# Patient Record
Sex: Female | Born: 1975
Health system: Southern US, Community
[De-identification: ages and names within clinical notes are randomized; demographics above are authoritative.]

## PROBLEM LIST (undated history)

## (undated) DIAGNOSIS — F32A Depression, unspecified: Secondary | ICD-10-CM

## (undated) DIAGNOSIS — Z800 Family history of malignant neoplasm of digestive organs: Secondary | ICD-10-CM

## (undated) DIAGNOSIS — Z15068 Genetic susceptibility to other malignant neoplasm of digestive system: Secondary | ICD-10-CM

## (undated) DIAGNOSIS — Z1509 Genetic susceptibility to other malignant neoplasm: Secondary | ICD-10-CM

## (undated) DIAGNOSIS — Z803 Family history of malignant neoplasm of breast: Secondary | ICD-10-CM

## (undated) DIAGNOSIS — K5900 Constipation, unspecified: Secondary | ICD-10-CM

## (undated) DIAGNOSIS — Z8049 Family history of malignant neoplasm of other genital organs: Secondary | ICD-10-CM

## (undated) DIAGNOSIS — E559 Vitamin D deficiency, unspecified: Secondary | ICD-10-CM

## (undated) DIAGNOSIS — R569 Unspecified convulsions: Secondary | ICD-10-CM

## (undated) DIAGNOSIS — Z15060 Genetic susceptibility to colorectal cancer: Secondary | ICD-10-CM

## (undated) DIAGNOSIS — E785 Hyperlipidemia, unspecified: Secondary | ICD-10-CM

## (undated) HISTORY — DX: Constipation, unspecified: K59.00

## (undated) HISTORY — DX: Vitamin D deficiency, unspecified: E55.9

## (undated) HISTORY — PX: WISDOM TOOTH EXTRACTION: SHX21

## (undated) HISTORY — DX: Family history of malignant neoplasm of other genital organs: Z80.49

## (undated) HISTORY — DX: Hyperlipidemia, unspecified: E78.5

## (undated) HISTORY — PX: TUBAL LIGATION: SHX77

## (undated) HISTORY — DX: Genetic susceptibility to colorectal cancer: Z15.060

## (undated) HISTORY — DX: Genetic susceptibility to other malignant neoplasm of digestive system: Z15.068

## (undated) HISTORY — DX: Family history of malignant neoplasm of digestive organs: Z80.0

## (undated) HISTORY — DX: Depression, unspecified: F32.A

## (undated) HISTORY — DX: Family history of malignant neoplasm of breast: Z80.3

## (undated) HISTORY — DX: Genetic susceptibility to other malignant neoplasm: Z15.09

## (undated) HISTORY — PX: COLONOSCOPY: SHX174

## (undated) HISTORY — PX: ABDOMINAL HYSTERECTOMY: SHX81

## (undated) HISTORY — DX: Unspecified convulsions: R56.9

---

## 2013-12-22 ENCOUNTER — Encounter: Payer: Self-pay | Admitting: Neurology

## 2013-12-22 ENCOUNTER — Other Ambulatory Visit (INDEPENDENT_AMBULATORY_CARE_PROVIDER_SITE_OTHER): Payer: 59

## 2013-12-22 ENCOUNTER — Ambulatory Visit (INDEPENDENT_AMBULATORY_CARE_PROVIDER_SITE_OTHER): Payer: 59 | Admitting: Neurology

## 2013-12-22 VITALS — BP 113/67 | HR 72 | Ht 64.0 in | Wt 174.3 lb

## 2013-12-22 DIAGNOSIS — G40109 Localization-related (focal) (partial) symptomatic epilepsy and epileptic syndromes with simple partial seizures, not intractable, without status epilepticus: Secondary | ICD-10-CM

## 2013-12-22 DIAGNOSIS — R471 Dysarthria and anarthria: Secondary | ICD-10-CM

## 2013-12-22 DIAGNOSIS — R4701 Aphasia: Secondary | ICD-10-CM

## 2013-12-22 NOTE — Patient Instructions (Signed)
I had a long discussion with the patient regarding her episodes of transient speech difficulty/expressive aphasia and explained the differential diagnosis, plan for evaluation, treatment and answered questions. I recommend we check MRI scan of the brain for structural lesions and EEG for seizures. Review prior neurological records from [facility] neurology. We will hold off on an empirical trial of anticonvulsants at the present time unless episodes occur much more frequently. I have advised her to keep a strict track of these episodes. Return for followup in 2 months or call earlier if necessary.

## 2013-12-22 NOTE — Progress Notes (Signed)
[facility] [address] [place]. [place] [address] [phone]       OFFICE CONSULT NOTE  [name] Date of Birth:  [birth year] Medical Record Number:  [id number]   Referring MD:  Horald Pollen  Reason for Referral:  Speech arrest  HPI: 84 year Caucasian lady whose had recurrent intermittent episodes of transient speech and language difficulties for the last 5 years. The spells have increased in frequency over the last 1 year. She is states that the episode begins with patient having a kind of  aura and she is usually able to tell that she'll have trouble speaking. She cannot speak and usually just bladder the stomach frustrating sounds the tongue feels thick and heavy. Occasionally she is able to speak a few words with difficulty. In fact in the last episode 2 weeks ago she was able to tell her daughter to record the episode. She shows me a video  of this episode on her cell phone in which she is clearly frustrated in trying to talk and can speak a few isolated words with great difficulty. She seems quite awake and aware of her surroundings without any drooling of saliva or focal jerking or twitching noted on her face. Episodes never last more than 2-3 minutes. They can occur several days in a row and then not occur for months. There is no obvious triggers which will bring it on. Following that showed she feels quite tired and exhausted for a few hours. She is unable to tell me if she loses touch of her surroundings and can follow other people directions during the episode. She has never had an occasion to try writing during the episodes. She has no prior history of migraine headaches, seizures, loss of consciousness, significant head injury. Family history of epilepsy. She has no other history to suggest strokes TIAs in the past. She has seen a neurologist in [facility] for these episodes but cannot tell me the names and I have no records to review today. She does  not recall having had an EEG her MRI done. She had some recent lab work done on 12/15/13  by her primary physician which I have reviewed which showed normal comprehensive metabolic panel labs, ESR and TSH total cholesterol was 213, triglycerides 196, HDL 41  ROS:   14 system review of systems is positive for speech difficulty, expressive difficulties, emotional lability, frustration, anxiety and all other systems negative PMH: No past medical history on file.  Social History:  History   Social History  . Marital Status: Single    Spouse Name: N/A    Number of Children: 3  . Years of Education: AS   Occupational History  . [employer]    Social History Main Topics  . Smoking status: Never Smoker   . Smokeless tobacco: Not on file  . Alcohol Use: No  . Drug Use: No  . Sexual Activity: Yes   Other Topics Concern  . Not on file   Social History Narrative   Patient lives at home with her children    Patient is right handed   Patient drinks coffee daily    Medications:   No current outpatient prescriptions on file prior to visit.   No current facility-administered medications on file prior to visit.    Allergies:  No Known Allergies  Physical Exam General: well developed, well nourished, seated, in no evident distress Head: head normocephalic and atraumatic. Orohparynx benign Neck: supple with no  carotid or supraclavicular bruits Cardiovascular: regular rate and rhythm, no murmurs Musculoskeletal: no deformity Skin:  no rash/petichiae Vascular:  Normal pulses all extremities Filed Vitals:   12/22/13 0833  BP: 113/67  Pulse: 72    Neurologic Exam Mental Status: Awake and fully alert. Oriented to place and time. Recent and remote memory intact. Attention span, concentration and fund of knowledge appropriate. Mood and affect appropriate.  Cranial Nerves: Fundoscopic exam reveals sharp disc margins. Pupils equal, briskly reactive to light. Extraocular movements  full without nystagmus. Visual fields full to confrontation. Hearing intact. Facial sensation intact. Face, tongue, palate moves normally and symmetrically.  Motor: Normal bulk and tone. Normal strength in all tested extremity muscles. Sensory.: intact to tough and pinprick and vibratory.  Coordination: Rapid alternating movements normal in all extremities. Finger-to-nose and heel-to-shin performed accurately bilaterally. Gait and Station: Arises from chair without difficulty. Stance is normal. Gait demonstrates normal stride length and balance . Able to heel, toe and tandem walk without difficulty.  Reflexes: 1+ and symmetric. Toes downgoing.   NIHSS  0 Modified Rankin 0   ASSESSMENT: 23 year Caucasian lady with a recurrent transient episodes of speech and expressive language difficulties of unclear etiology. 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 Migraine  Or TIAs would be less likely.    PLAN: I had a long discussion with the patient regarding her episodes of transient speech difficulty/expressive aphasia and explained the differential diagnosis, plan for evaluation, treatment and answered questions. I recommend we check MRI scan of the brain for structural lesions and EEG for seizures. Review prior neurological records from [facility] neurolog[facility]. We will hold off on an empirical trial of anticonvulsants at the present time unless episodes occur much more frequently. I have advised her to keep a strict track of these episodes. Return for followup in 2 months or call earlier if necessary.   Note: This document was prepared with digital dictation and possible smart phrase technology. Any transcriptional errors that result from this process are unintentional.

## 2013-12-28 ENCOUNTER — Other Ambulatory Visit: Payer: 59

## 2014-06-05 ENCOUNTER — Telehealth: Payer: Self-pay | Admitting: Neurology

## 2014-06-05 NOTE — Telephone Encounter (Signed)
Patient is calling because she has had 3 more seizures.  Please call and advise if she needs an appt before 3/18 as scheduled.

## 2014-06-05 NOTE — Telephone Encounter (Signed)
Spoke with patient and scheduled appointment with Dr [name] for 06/08/14 at 2 pm

## 2014-06-08 ENCOUNTER — Ambulatory Visit (INDEPENDENT_AMBULATORY_CARE_PROVIDER_SITE_OTHER): Payer: 59 | Admitting: Neurology

## 2014-06-08 ENCOUNTER — Encounter: Payer: Self-pay | Admitting: Neurology

## 2014-06-08 VITALS — BP 122/72 | HR 70 | Ht 64.0 in | Wt 179.0 lb

## 2014-06-08 DIAGNOSIS — G40109 Localization-related (focal) (partial) symptomatic epilepsy and epileptic syndromes with simple partial seizures, not intractable, without status epilepticus: Secondary | ICD-10-CM

## 2014-06-08 MED ORDER — LEVETIRACETAM ER 500 MG PO TB24: 500.0000 mg | ORAL_TABLET | Freq: Every day | ORAL | Status: DC

## 2014-06-08 NOTE — Patient Instructions (Signed)
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 Episodes occur at a variable frequency but I think it may be worthwhile giving her empirical trial of anticonvulsants. Recommend she take Keppra XR 500 mg daily for at least 6 months. Repeat EEG study and MRI scan of the brain with and without contrast. Return for follow-up in 2 months or call earlier if necessary

## 2014-06-09 NOTE — Progress Notes (Signed)
[facility] [address] [place]. [place] [address] [phone]       OFFICE CONSULT NOTE  [name] Date of Birth:  02-May-1976 Medical Record Number:  [id number]   Referring MD:  Horald Pollen  Reason for Referral:  Speech arrest  HPI: 77 year Caucasian lady whose had recurrent intermittent episodes of transient speech and language difficulties for the last 5 years. The spells have increased in frequency over the last 1 year. She is states that the episode begins with patient having a kind of  aura and she is usually able to tell that she'll have trouble speaking. She cannot speak and usually just bladder the stomach frustrating sounds the tongue feels thick and heavy. Occasionally she is able to speak a few words with difficulty. In fact in the last episode 2 weeks ago she was able to tell her daughter to record the episode. She shows me a video  of this episode on her cell phone in which she is clearly frustrated in trying to talk and can speak a few isolated words with great difficulty. She seems quite awake and aware of her surroundings without any drooling of saliva or focal jerking or twitching noted on her face. Episodes never last more than 2-3 minutes. They can occur several days in a row and then not occur for months. There is no obvious triggers which will bring it on. Following that showed she feels quite tired and exhausted for a few hours. She is unable to tell me if she loses touch of her surroundings and can follow other people directions during the episode. She has never had an occasion to try writing during the episodes. She has no prior history of migraine headaches, seizures, loss of consciousness, significant head injury. Family history of epilepsy. She has no other history to suggest strokes TIAs in the past. She has seen a neurologist in [facility] for these episodes but cannot tell me the names and I have no records to review today. She does  not recall having had an EEG her MRI done. She had some recent lab work done on 12/15/13  by her primary physician which I have reviewed which showed normal comprehensive metabolic panel labs, ESR and TSH total cholesterol was 213, triglycerides 196, HDL 41 Update 06/08/2014 : She returns for follow-up after last visit 5 months ago. She continues to have intermittent episodes of transient speech and expressive language difficulties. She is accompanied by her mother stating that she had a brief episode today while in the parking lot coming to my office visit. Patient states that she had 2 other episodes on January 8 as well as December 19. In one of these she had a feeling of her neck being pulled to the left as well as a weird feeling in her left ear followed. Later by trouble speaking and expressing herself in completing sentences. She also had a blank look on her face and was apparently staring into space. She denies loss of memory and is able to recall the whole episode. She denied any headache before during or after the episode. She had no extremity weakness numbness gait or balance problems. She had an EEG done on 12/22/2013   which I personally reviewed which was normal without definite epileptiform features. She has no known prior history of seizures. ROS:   14 system review of systems is positive for speech difficulty, expressive difficulties, and all other systems negative PMH: No past medical history on  file.  Social History:  History   Social History  . Marital Status: Single    Spouse Name: N/A    Number of Children: 3  . Years of Education: AS   Occupational History  . [employer]    Social History Main Topics  . Smoking status: Never Smoker   . Smokeless tobacco: Not on file  . Alcohol Use: No  . Drug Use: No  . Sexual Activity: Yes   Other Topics Concern  . Not on file   Social History Narrative   Patient lives at home with her children    Patient is right handed    Patient drinks coffee daily    Medications:   No current outpatient prescriptions on file prior to visit.   No current facility-administered medications on file prior to visit.    Allergies:  No Known Allergies  Physical Exam General: well developed, well nourished, seated, in no evident distress Head: head normocephalic and atraumatic. Orohparynx benign Neck: supple with no carotid or supraclavicular bruits Cardiovascular: regular rate and rhythm, no murmurs Musculoskeletal: no deformity Skin:  no rash/petichiae Vascular:  Normal pulses all extremities Filed Vitals:   06/08/14 1403  BP: 122/72  Pulse: 70    Neurologic Exam Mental Status: Awake and fully alert. Oriented to place and time. Recent and remote memory intact. Attention span, concentration and fund of knowledge appropriate. Mood and affect appropriate.  Cranial Nerves: Fundoscopic exam not done   Pupils equal, briskly reactive to light. Extraocular movements full without nystagmus. Visual fields full to confrontation. Hearing intact. Facial sensation intact. Face, tongue, palate moves normally and symmetrically.  Motor: Normal bulk and tone. Normal strength in all tested extremity muscles. Sensory.: intact to tough and pinprick and vibratory.  Coordination: Rapid alternating movements normal in all extremities. Finger-to-nose and heel-to-shin performed accurately bilaterally. Gait and Station: Arises from chair without difficulty. Stance is normal. Gait demonstrates normal stride length and balance . Able to heel, toe and tandem walk without difficulty.  Reflexes: 1+ and symmetric. Toes downgoing.     ASSESSMENT: 9 year Caucasian lady with a recurrent transient episodes of speech and expressive language difficulties of unclear etiology. 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 Migraine  Or TIAs would be less likely.    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 Episodes occur at a variable frequency but I think it may be worthwhile giving her empirical trial of anticonvulsants. Recommend she take Keppra XR 500 mg daily for at least 6 months. Repeat EEG study and MRI scan of the brain with and without contrast. Return for follow-up in 2 months or call earlier if necessary   Antony Contras, MD Note: This document was prepared with digital dictation and possible smart phrase technology. Any transcriptional errors that result from this process are unintentional.

## 2014-06-21 ENCOUNTER — Ambulatory Visit (INDEPENDENT_AMBULATORY_CARE_PROVIDER_SITE_OTHER): Payer: 59

## 2014-06-21 ENCOUNTER — Other Ambulatory Visit (INDEPENDENT_AMBULATORY_CARE_PROVIDER_SITE_OTHER): Payer: 59 | Admitting: Neurology

## 2014-06-21 DIAGNOSIS — G40109 Localization-related (focal) (partial) symptomatic epilepsy and epileptic syndromes with simple partial seizures, not intractable, without status epilepticus: Secondary | ICD-10-CM

## 2014-06-21 MED ORDER — GADOPENTETATE DIMEGLUMINE 469.01 MG/ML IV SOLN: 15.0000 mL | Freq: Once | INTRAVENOUS | Status: AC | PRN

## 2014-07-10 ENCOUNTER — Telehealth: Payer: Self-pay | Admitting: *Deleted

## 2014-07-10 NOTE — Telephone Encounter (Signed)
Patient had MRI on 06-22-14 and would like results. Please advise.

## 2014-07-10 NOTE — Telephone Encounter (Signed)
Spoke with patient and informed her of normal MRI and confirmed her appointment for 08/04/14 at 10:30 am with Dr [name]. Patient states that she will wait for appointment to go over MRI

## 2014-08-04 ENCOUNTER — Encounter: Payer: Self-pay | Admitting: Neurology

## 2014-08-04 ENCOUNTER — Ambulatory Visit (INDEPENDENT_AMBULATORY_CARE_PROVIDER_SITE_OTHER): Payer: 59 | Admitting: Neurology

## 2014-08-04 VITALS — BP 113/71 | HR 65 | Ht 64.0 in | Wt 181.2 lb

## 2014-08-04 DIAGNOSIS — R569 Unspecified convulsions: Secondary | ICD-10-CM | POA: Diagnosis not present

## 2014-08-04 DIAGNOSIS — IMO0001 Reserved for inherently not codable concepts without codable children: Secondary | ICD-10-CM

## 2014-08-04 DIAGNOSIS — R6889 Other general symptoms and signs: Principal | ICD-10-CM

## 2014-08-04 NOTE — Progress Notes (Signed)
[facility] [address] [place]. [place] [address] [phone]       OFFICE FOLLOW UP VISIT  NOTE  [name] Date of Birth:  1976/04/21 Medical Record Number:  [id number]   Referring MD:  Horald Pollen  Reason for Referral:  Speech arrest  HPI:  Initial Consult 06/09/2014 : 56 year Caucasian lady whose had recurrent intermittent episodes of transient speech and language difficulties for the last 5 years. The spells have increased in frequency over the last 1 year. She is states that the episode begins with patient having a kind of  aura and she is usually able to tell that she'll have trouble speaking. She cannot speak and usually just bladder the stomach frustrating sounds the tongue feels thick and heavy. Occasionally she is able to speak a few words with difficulty. In fact in the last episode 2 weeks ago she was able to tell her daughter to record the episode. She shows me a video  of this episode on her cell phone in which she is clearly frustrated in trying to talk and can speak a few isolated words with great difficulty. She seems quite awake and aware of her surroundings without any drooling of saliva or focal jerking or twitching noted on her face. Episodes never last more than 2-3 minutes. They can occur several days in a row and then not occur for months. There is no obvious triggers which will bring it on. Following that showed she feels quite tired and exhausted for a few hours. She is unable to tell me if she loses touch of her surroundings and can follow other people directions during the episode. She has never had an occasion to try writing during the episodes. She has no prior history of migraine headaches, seizures, loss of consciousness, significant head injury. Family history of epilepsy. She has no other history to suggest strokes TIAs in the past. She has seen a neurologist in [facility] for these episodes but cannot tell me the names and I  have no records to review today. She does not recall having had an EEG her MRI done. She had some recent lab work done on 12/15/13  by her primary physician which I have reviewed which showed normal comprehensive metabolic panel labs, ESR and TSH total cholesterol was 213, triglycerides 196, HDL 41 Update 06/08/2014 : She returns for follow-up after last visit 5 months ago. She continues to have intermittent episodes of transient speech and expressive language difficulties. She is accompanied by her mother stating that she had a brief episode today while in the parking lot coming to my office visit. Patient states that she had 2 other episodes on January 8 as well as December 19. In one of these she had a feeling of her neck being pulled to the left as well as a weird feeling in her left ear followed. Later by trouble speaking and expressing herself in completing sentences. She also had a blank look on her face and was apparently staring into space. She denies loss of memory and is able to recall the whole episode. She denied any headache before during or after the episode. She had no extremity weakness numbness gait or balance problems. She had an EEG done on 12/22/2013   which I personally reviewed which was normal without definite epileptiform features. She has no known prior history of seizures. Update 08/04/2014 ; she returns for follow-up after last visit 2 months ago. She has had no further  episodes of speech or language difficulties. She did not try the Ver[place] which I prescribed at last visit as she wants to wait till she has another episode. She had a repeat MRI scan of the brain done on 06/15/14 which are personally reviewed and appears normal. She had EEG also done on 06/21/14 which was normal without any epileptiform activities. She plans to exercise regularly and lose some weight. I also encouraged her to increase perspiration and stress relaxation activities. She has no other complaints. ROS:   14 system  review of systems is positive for speech difficulty, expressive difficulties, and all other systems negative PMH:  Past Medical History  Diagnosis Date  . Seizures     Social History:  History   Social History  . Marital Status: Single    Spouse Name: N/A  . Number of Children: 3  . Years of Education: AS   Occupational History  . [employer]    Social History Main Topics  . Smoking status: Never Smoker   . Smokeless tobacco: Never Used  . Alcohol Use: No  . Drug Use: No  . Sexual Activity: Yes   Other Topics Concern  . Not on file   Social History Narrative   Patient lives at home with her children    Patient is right handed   Patient drinks coffee daily    Medications:   Current Outpatient Prescriptions on File Prior to Visit  Medication Sig Dispense Refill  . levETIRAcetam (KEPPRA XR) 500 MG 24 hr tablet Take 1 tablet (500 mg total) by mouth daily. (Patient not taking: Reported on 08/04/2014) 30 tablet 3   No current facility-administered medications on file prior to visit.    Allergies:  No Known Allergies  Physical Exam General: well developed, well nourished young Caucasian lady, seated, in no evident distress Head: head normocephalic and atraumatic.    Neck: supple with no carotid or supraclavicular bruits Cardiovascular: regular rate and rhythm, no murmurs Musculoskeletal: no deformity Skin:  no rash/petichiae Vascular:  Normal pulses all extremities Filed Vitals:   08/04/14 1056  BP: 113/71  Pulse: 65    Neurologic Exam Mental Status: Awake and fully alert. Oriented to place and time. Recent and remote memory intact. Attention span, concentration and fund of knowledge appropriate. Mood and affect appropriate.  Cranial Nerves: Fundoscopic exam not done   Pupils equal, briskly reactive to light. Extraocular movements full without nystagmus. Visual fields full to confrontation. Hearing intact. Facial sensation intact. Face, tongue, palate moves  normally and symmetrically.  Motor: Normal bulk and tone. Normal strength in all tested extremity muscles. Sensory.: intact to tough and pinprick and vibratory.  Coordination: Rapid alternating movements normal in all extremities. Finger-to-nose and heel-to-shin performed accurately bilaterally. Gait and Station: Arises from chair without difficulty. Stance is normal. Gait demonstrates normal stride length and balance . Able to heel, toe and tandem walk without difficulty.  Reflexes: 1+ and symmetric. Toes downgoing.     ASSESSMENT: 41 year Caucasian lady with a recurrent transient episodes of speech and expressive language difficulties of unclear etiology. 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 Migraine  Or TIAs would be less likely.    PLAN:  I had a long discussion with the patient with regards to her episodes and personally reviewed and discuss MRI films and EEG results and answered questions. She appears reluctant to go on empirical trial of Keppra at the present time as she has been episode free for 2 months. She  however will consider trying it when she has future episodes. Return for follow-up in 3 months or call earlier if necessary. Antony Contras, MD Note: This document was prepared with digital dictation and possible smart phrase technology. Any transcriptional errors that result from this process are unintentional.

## 2014-08-04 NOTE — Patient Instructions (Signed)
  I had a long discussion with the patient with regards to her episodes and personally reviewed and discuss MRI films and EEG results and answered questions. She appears reluctant to go on empirical trial of Keppra at the present time as she has been episode free for 2 months. She however will consider trying it when she has future episodes. Return for follow-up in 3 months or call earlier if necessary.

## 2014-08-07 ENCOUNTER — Ambulatory Visit (HOSPITAL_BASED_OUTPATIENT_CLINIC_OR_DEPARTMENT_OTHER): Payer: 59 | Admitting: Genetic Counselor

## 2014-08-07 ENCOUNTER — Other Ambulatory Visit: Payer: 59

## 2014-08-07 ENCOUNTER — Encounter: Payer: Self-pay | Admitting: Genetic Counselor

## 2014-08-07 DIAGNOSIS — Z8049 Family history of malignant neoplasm of other genital organs: Secondary | ICD-10-CM

## 2014-08-07 DIAGNOSIS — Z803 Family history of malignant neoplasm of breast: Secondary | ICD-10-CM

## 2014-08-07 DIAGNOSIS — Z808 Family history of malignant neoplasm of other organs or systems: Secondary | ICD-10-CM

## 2014-08-07 DIAGNOSIS — Z800 Family history of malignant neoplasm of digestive organs: Secondary | ICD-10-CM | POA: Insufficient documentation

## 2014-08-07 DIAGNOSIS — Z315 Encounter for genetic counseling: Secondary | ICD-10-CM

## 2014-08-07 NOTE — Progress Notes (Signed)
REFERRING PROVIDER: Hayden Rasmussen, MD [place], [place] [address]  PRIMARY PROVIDER:  Hayden Rasmussen., MD  PRIMARY REASON FOR VISIT:  1. Family history of uterine cancer   2. Family history of breast cancer   3. Family history of Lynch syndrome      HISTORY OF PRESENT ILLNESS:   Ms. Gu, a 39 y.o. female, was seen for a [place] cancer genetics consultation at the request of Dr. Darron Doom due to a family history of cancer.  Ms. Batley presents to clinic today to discuss the possibility of a hereditary predisposition to cancer, genetic testing, and to further clarify her future cancer risks, as well as potential cancer risks for family members. Ms. Northrop is a 39 y.o. female with no personal history of cancer.  Her mother was diagnosed with uterine cancer and breast cancer, and had genetic testing which found a PMS2 mutation, causitive of Lynch syndrome.  Reportedly her sister also was found to be positive for PMS2 and had a hysterectomy this past year.   CANCER HISTORY:   No history exists.    RISK FACTORS Ovaries intact: yes.  Hysterectomy: no.  Menopausal status: premenopausal.  HRT use: 0 years. Colonoscopy: no; not examined. Mammogram within the last year: yes. Number of breast biopsies: 0. Up to date with pelvic exams:  yes. Any excessive radiation exposure in the past:  no  Past Medical History  Diagnosis Date  . Seizures   . Family history of uterine cancer   . Family history of breast cancer   . Family history of Lynch syndrome     Past Surgical History  Procedure Laterality Date  . Cesarean section      History   Social History  . Marital Status: Single    Spouse Name: N/A  . Number of Children: 3  . Years of Education: AS   Occupational History  . [employer]    Social History Main Topics  . Smoking status: Never Smoker   . Smokeless tobacco: Never Used  . Alcohol Use: No  . Drug Use: No  . Sexual Activity: Yes   Other  Topics Concern  . None   Social History Narrative   Patient lives at home with her children    Patient is right handed   Patient drinks coffee daily     FAMILY HISTORY:  We obtained a detailed, 4-generation family history.  Significant diagnoses are listed below: Family History  Problem Relation Age of Onset  . Breast cancer Mother 20  . Uterine cancer Mother 80  . COPD Maternal Aunt   . Prostate cancer Maternal Grandfather     dx in his 49s  . Cancer Cousin 6    wilms tumor  . Colon cancer Cousin     mother's maternal cousin  . Colon cancer Other     mother's maternal great aunt  . Pancreatic cancer Cousin     mother's paternal first cousin   Ms. Pinales's mother was diagnosed with breast and uterine cancer, and was ultimately found to have Lynch syndrome.  Reportedly her sister has as well.  Ms. Kaner mother had two sisters and two brothers, none of whom had cancer.  One sister had a daughter diagnosed at age 53 with wilms tumor.  Ms. Badilla mother had a maternal cousin and maternal great aunt with colon cancer and a paternal cousin with pancreatic cancer.   Patient's maternal ancestors are of Caucasian descent, and paternal ancestors are  of Caucasian descent. There is no reported Ashkenazi Jewish ancestry. There is no known consanguinity.  GENETIC COUNSELING ASSESSMENT: Teola Felipe is a 39 y.o. female with a family history of Lynch syndrome and a predisposition to cancer. We, therefore, discussed and recommended the following at today's visit.   DISCUSSION: We reviewed the characteristics, features and inheritance patterns of Lynch syndromes. Based on her mother's genetic test result, Ms. Pointer is at 50% risk for inheriting the PMS2 mutation found in both her mother and sister.  We discussed the increased risk for colon, uterine and ovarian cancer with PMS2 mutations, but that other cancers typcially seen in Lynch syndrome are much less frequent and less concerning with PMS2  mutations.  We discussed how her medical management would change should she test positive and that we would recommend genetic testing for her children.  We also discussed informing her maternal aunts and uncles of this result.  Wilms tumor can sometimes be seen in Lynch syndrome, therefore we would want to test Ms. Mastel's maternal aunt to confirm whether she too has a PMS2 mutation that could have contributed to the wilms tumor and therefore put her children at risk for cancer, as well as herself.  We also discussed genetic testing, including the appropriate family members to test, the process of testing, insurance coverage and turn-around-time for results. We discussed the implications of a negative, positive and/or variant of uncertain significant result. We recommended Ms. Pottinger pursue genetic testing for the targeted PMS2 gene mutation.  The mutation found in Ms. Seelinger's family is PMS2 c.137G>T.Marland Kitchen   PLAN: After considering the risks, benefits, and limitations, Ms. Dow  provided informed consent to pursue genetic testing and the blood sample was sent to [facility] for analysis of the PMS2 gene test. Results should be available within approximately 3-4 weeks' time, at which point they will be disclosed by telephone to Ms. Bovee, as will any additional recommendations warranted by these results. Ms. Amara will receive a summary of her genetic counseling visit and a copy of her results once available. This information will also be available in Epic. We encouraged Ms. Grisanti to remain in contact with cancer genetics annually so that we can continuously update the family history and inform her of any changes in cancer genetics and testing that may be of benefit for her family. Ms. Laster questions were answered to her satisfaction today. Our contact information was provided should additional questions or concerns arise.  Lastly, we encouraged Ms. Cuthrell to remain in contact with cancer genetics annually so that  we can continuously update the family history and inform her of any changes in cancer genetics and testing that may be of benefit for this family.   Ms.  Karel questions were answered to her satisfaction today. Our contact information was provided should additional questions or concerns arise. Thank you for the referral and allowing [place] to share in the care of your patient.   Darryel Diodato P. Florene Glen, [place], [facility] Certified Genetic Counselor [email or web address] phone: [phone]  The patient was seen for a total of 45 minutes in face-to-face genetic counseling.  This patient was discussed with Drs. Magrinat, Lindi Adie and/or Burr Medico who agrees with the above.    _______________________________________________________________________ For Office Staff:  Number of people involved in session: 2 Was an Intern/ student involved with case: yes

## 2014-08-15 ENCOUNTER — Telehealth: Payer: Self-pay | Admitting: Genetic Counselor

## 2014-08-15 ENCOUNTER — Encounter: Payer: Self-pay | Admitting: Genetic Counselor

## 2014-08-15 DIAGNOSIS — Z1379 Encounter for other screening for genetic and chromosomal anomalies: Secondary | ICD-10-CM | POA: Insufficient documentation

## 2014-08-15 DIAGNOSIS — Z1509 Genetic susceptibility to other malignant neoplasm: Secondary | ICD-10-CM | POA: Insufficient documentation

## 2014-08-15 NOTE — Telephone Encounter (Signed)
Alva Garnet, genetic counseling intern, revealed the PMS2 mutation found on genetic testing.  Patient is scheduled to return for genetic counseling on Wednesday March 30 at 3 PM

## 2014-08-16 ENCOUNTER — Ambulatory Visit (HOSPITAL_BASED_OUTPATIENT_CLINIC_OR_DEPARTMENT_OTHER): Payer: 59 | Admitting: Genetic Counselor

## 2014-08-16 ENCOUNTER — Encounter: Payer: Self-pay | Admitting: Genetic Counselor

## 2014-08-16 DIAGNOSIS — Z1379 Encounter for other screening for genetic and chromosomal anomalies: Secondary | ICD-10-CM

## 2014-08-16 DIAGNOSIS — Z315 Encounter for genetic counseling: Secondary | ICD-10-CM | POA: Diagnosis not present

## 2014-08-16 DIAGNOSIS — Z1509 Genetic susceptibility to other malignant neoplasm: Secondary | ICD-10-CM

## 2014-08-16 DIAGNOSIS — Z803 Family history of malignant neoplasm of breast: Secondary | ICD-10-CM | POA: Diagnosis not present

## 2014-08-16 DIAGNOSIS — Z8049 Family history of malignant neoplasm of other genital organs: Secondary | ICD-10-CM | POA: Diagnosis not present

## 2014-08-16 NOTE — Progress Notes (Signed)
REFERRING PROVIDER: Hayden Rasmussen, MD [place], [place] [address]   Truitt Merle, MD  PRIMARY PROVIDER:  Hayden Mason., MD  PRIMARY REASON FOR VISIT:  1. Family history of uterine cancer   2. Family history of breast cancer   3. Lynch syndrome   4. Genetic testing   5. PMS2-related Lynch syndrome (HNPCC4)      HISTORY OF PRESENT ILLNESS:   [name], a 39 y.o. female, was seen to discuss her genetic test results that revealed she has the familial PMS2 mutation which has diagnosed [name] with Lynch syndrome.  [name] presents to clinic today to discuss Lynch syndrome and her hereditary predisposition to cancer, and to further clarify her future cancer risks, as well as potential cancer risks for family members. [name] has no personal history of cancer.  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  CANCER HISTORY:   No history exists.    Past Medical History  Diagnosis Date  . Seizures   . Family history of uterine cancer   . Family history of breast cancer   . Family history of Lynch syndrome   . Lynch syndrome   . PMS2-related Lynch syndrome [facility])     Past Surgical History  Procedure Laterality Date  . Cesarean section      History   Social History  . Marital Status: Single    Spouse Name: N/A  . Number of Children: 3  . Years of Education: AS   Occupational History  . [employer]    Social History Main Topics  . Smoking status: Never Smoker   . Smokeless tobacco: Never Used  . Alcohol Use: No  . Drug Use: No  . Sexual Activity: Yes   Other Topics Concern  . None   Social History Narrative   Patient lives at home with her children    Patient is right handed   Patient drinks coffee daily     FAMILY HISTORY:  We obtained a detailed, 4-generation family history.  Significant diagnoses are listed below: Family History  Problem Relation Age of Onset  . Breast cancer Mother 47  . Uterine cancer Mother 58  . COPD  Maternal Aunt   . Prostate cancer Maternal Grandfather     dx in his 50s  . Cancer Cousin 6    wilms tumor  . Colon cancer Cousin     mother's maternal cousin  . Colon cancer Other     mother's maternal great aunt  . Pancreatic cancer Cousin     mother's paternal first cousin    GENETIC COUNSELING ASSESSMENT: Leah Mason is a 39 y.o. female with a PMS2 mutation which is suggestive of a Lynch syndrome and predisposition to cancer. We, therefore, discussed and recommended the following at today's visit.   DISCUSSION: We reviewed the characteristics, features and inheritance patterns of Lynch syndromes. We discussed that a PMS2 mutation increases the lifetime risk of colon cancer to about 20% and the lifetime risk for uterine cancer to about 15%. Therefore, according to NCCN guidelines, a colonoscopy should be performed every 1-2 years starting at 20-25 years. Additionally screening for endometrial and ovarian cancer is not sensitive. Therefore a prophylactic TAH-BSO is recommended once families are completed. The risks for all other extracolonic cancers is low. Due to limited data, there are no other recommended screening options for these cancers.   [name] has not had a colonoscopy yet, and is dreading it.  She has had a colonoscopy in the past for a different reason, and "it was awful and I swore I would never do it again".  We discussed the process of colonoscopy and have referred her to the [facility] group for a consultation.  Additionally, she will go back to Leah Mason to discuss TAH-BSO.  Lastly, we will refer [name] to Leah Mason in the [facility] to ensure that she is fully plugged in for her screening.  PLAN: Based on [name]'s family history, we recommended her maternal aunts and uncles and cousins have genetic counseling and testing. [name] will let us know if we can be of any assistance in coordinating genetic counseling and/or testing for this  family member. [name] was given information on how to set up appointments with [facility] and I will have Leah Mason call her to set up an appointment with Leah Mason.  [name] will call Dr. Darron Doom to set up an appointment.  Lastly, we encouraged [name] to remain in contact with cancer genetics annually so that we can continuously update the family history and inform her of any changes in cancer genetics and testing that may be of benefit for this family.   Ms.  Leah Mason questions were answered to her satisfaction today. Our contact information was provided should additional questions or concerns arise. Thank you for the referral and allowing [place] to share in the care of your patient.   Leah Mason P. Florene Glen, [place], [facility] Certified Genetic Counselor [email or web address] phone: [phone]  The patient was seen for a total of 30 minutes in face-to-face genetic counseling.  This patient was discussed with Drs. Magrinat, Lindi Adie and/or Burr Mason who agrees with the above.    _______________________________________________________________________ For Office Staff:  Number of people involved in session: 1 Was an Intern/ student involved with case: yes

## 2014-08-17 ENCOUNTER — Telehealth: Payer: Self-pay | Admitting: *Deleted

## 2014-08-17 NOTE — Telephone Encounter (Signed)
Received call from Maylon CosKaren Powell to schedule pt w/ Dr. Mosetta PuttFeng for High Risk.  Called pt and mail box has not been set up yet.  I will call again.

## 2014-08-18 ENCOUNTER — Telehealth: Payer: Self-pay | Admitting: *Deleted

## 2014-08-18 NOTE — Telephone Encounter (Signed)
Called pt and confirmed 09/05/14 high risk appt w/ pt.  Called Maylon CosKaren Powell to make her aware.  [facility]

## 2014-09-04 ENCOUNTER — Telehealth: Payer: Self-pay | Admitting: *Deleted

## 2014-09-04 NOTE — Telephone Encounter (Signed)
Was unable to leave a message for the pt, called mother and got her on the phone. Asked her to tell her daughter to call us back asap so we can get her follow up appt rescheduled. When the pt calls back, please reschedule her

## 2014-09-05 ENCOUNTER — Ambulatory Visit (HOSPITAL_BASED_OUTPATIENT_CLINIC_OR_DEPARTMENT_OTHER): Payer: 59 | Admitting: Hematology

## 2014-09-05 ENCOUNTER — Ambulatory Visit: Payer: 59

## 2014-09-05 ENCOUNTER — Encounter: Payer: Self-pay | Admitting: Hematology

## 2014-09-05 VITALS — BP 116/60 | HR 66 | Temp 98.4°F | Resp 20 | Ht 64.0 in | Wt 184.2 lb

## 2014-09-05 DIAGNOSIS — Z803 Family history of malignant neoplasm of breast: Secondary | ICD-10-CM | POA: Diagnosis not present

## 2014-09-05 DIAGNOSIS — Z1509 Genetic susceptibility to other malignant neoplasm: Secondary | ICD-10-CM

## 2014-09-05 DIAGNOSIS — Z808 Family history of malignant neoplasm of other organs or systems: Secondary | ICD-10-CM | POA: Diagnosis not present

## 2014-09-05 NOTE — Progress Notes (Signed)
[facility]  Telephone:([phone] Fax:([phone]) [place] Note   Patient Care Team: Hayden Rasmussen, MD as PCP - General (Family Medicine) 09/05/2014  CHIEF COMPLAINTS/PURPOSE OF CONSULTATION:  PMS2-related Lynch syndrome (HNPCC4)   HISTORY OF PRESENTING ILLNESS:  Leah Mason 39 y.o. female is here because of family history of brain syndrome andrecently discovered PMS2 gene mutation.  Her mother had breast cancer at age of 26, and uterine cancer at age of 47. She survived from both cancer, and underwent genetic counseling and testing. She was to found to have PMS2 gene mutation. So patient and her sister underwent genetic mutation and both were found to have the same gene mutation. Post-patient and her sister had a history of malignancy.  She is pretty healthy, no other medical issues. She feels well overall, denies any symptoms. She does have mild constipation, and occasionally notice blood in her stool, average once a month. She has some GI bleeding when she was in 40s, and underwent colonoscopy. The procedure was actually aborted because of the pain and intolerance. She has not had any colonoscopy since then. She did have screening mammogram several years ago after her mom was diagnosed with breast cancer, which was negative.   MEDICAL HISTORY:  Past Medical History  Diagnosis Date  . Seizures   . Family history of uterine cancer   . Family history of breast cancer   . Family history of Lynch syndrome   . Lynch syndrome   . PMS2-related Lynch syndrome (HNPCC4)    GYN HISTORY  Menarchal: 11 LMP: 08/24/2014 Contraceptive: no HRT: no G3P3: 12, 11 and 8    SURGICAL HISTORY: Past Surgical History  Procedure Laterality Date  . Cesarean section      SOCIAL HISTORY: History   Social History  . Marital Status: Single    Spouse Name: N/A  . Number of Children: 3  . Years of Education: AS   Occupational History  . [employer]    Social History Main Topics  . Smoking status: Never Smoker   . Smokeless tobacco: Never Used  . Alcohol Use: No  . Drug Use: No  . 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 Breast cancer Mother 61  . Uterine cancer Mother 67  . COPD Maternal Aunt   . Prostate cancer Maternal Grandfather     dx in his 63s  . Cancer Cousin 6    wilms tumor  . Colon cancer Cousin     mother's maternal cousin  . Colon cancer Other     mother's maternal great aunt  . Pancreatic cancer Cousin     mother's paternal first cousin    ALLERGIES:  has No Known Allergies.  MEDICATIONS:  None   REVIEW OF SYSTEMS:   Constitutional: Denies fevers, chills or abnormal night sweats Eyes: Denies blurriness of vision, double vision or watery eyes Ears, nose, mouth, throat, and face: Denies mucositis or sore throat Respiratory: Denies cough, dyspnea or wheezes Cardiovascular: Denies palpitation, chest discomfort or lower extremity swelling Gastrointestinal:  Denies nausea, heartburn or change in bowel habits Skin: Denies abnormal skin rashes Lymphatics: Denies new lymphadenopathy or easy bruising Neurological:Denies numbness, tingling or new  weaknesses Behavioral/Psych: Mood is stable, no new changes  All other systems were reviewed with the patient and are negative.  PHYSICAL EXAMINATION: ECOG PERFORMANCE STATUS: 0 - Asymptomatic  Filed Vitals:   09/05/14 1403  BP: 116/60  Pulse: 66  Temp: 98.4 F (36.9 C)  Resp: 20   Filed Weights   09/05/14 1403  Weight: 184 lb 3.2 oz (83.553 kg)    GENERAL:alert, no distress and comfortable SKIN: skin color, texture, turgor are normal, no rashes or significant lesions EYES: normal, conjunctiva are pink and non-injected, sclera  clear OROPHARYNX:no exudate, no erythema and lips, buccal mucosa, and tongue normal  NECK: supple, thyroid normal size, non-tender, without nodularity LYMPH:  no palpable lymphadenopathy in the cervical, axillary or inguinal LUNGS: clear to auscultation and percussion with normal breathing effort HEART: regular rate & rhythm and no murmurs and no lower extremity edema ABDOMEN:abdomen soft, non-tender and normal bowel sounds Musculoskeletal:no cyanosis of digits and no clubbing  PSYCH: alert & oriented x 3 with fluent speech NEURO: no focal motor/sensory deficits  LABORATORY DATA:  I have reviewed the data as listed No results found for: WBC, HGB, HCT, MCV, PLT No results for input(s): NA, K, CL, CO2, GLUCOSE, BUN, CREATININE, CALCIUM, GFRNONAA, GFRAA, PROT, ALBUMIN, AST, ALT, ALKPHOS, BILITOT, BILIDIR, IBILI in the last 8760 hours.  [facility] GENETIC RESULT: PMS2 c137g>t (p ser461Ile), heterozygous  RADIOGRAPHIC STUDIES: I have personally reviewed the radiological images as listed and agreed with the findings in the report. No results found.  ASSESSMENT & PLAN:  39 year old Caucasian female, with family history of breast cancer and uterine cancer in her mother, and subsequently was found to be PMS2 mutation heterozygous.  1. 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 -we discussed that this is one of the most common genetic mutations which causes Lynch syndrome. -patients with germline PMS 2 mutations carriers 15-20% lifetime risk of colon cancer by age of 91,  With the media age of onset in 62s, and 15% lifetime risk of endometrial cancer by age of 62, with media age of onset 40 years old. -PMS2 mutation carrier also has slightly increased risk of extra chronic malignancy, such as stomach, ovary, hepatobiliary track, urinary tract, small bowel, brain, and the combined risk for these malignancies is 6% to age 29. Routine screening for  these malignancy are not established and not recommended. -I strongly recommend her to start screening colonoscopy, every one to 2 years, to detected earlier stage colon cancer or precancer lesions. We also discussed various other screening tools such as CT colonography and stool test. She had bad experience with colonoscopy 10 years ago, after lengthy discussion, she agrees to have screening colonoscopy. I'll set up her GI referral. -I also discussed the benefit of low-dose aspirin to reduce risk of colon cancer. This has been showed in multiple clinical trials, but has not been the standard recommendation in the high-risk population like her. Side effects of aspirin were discussed with patient. She agrees to take it. -I also recommend prophylactic hysterectomy. She has no plan to have more children, and agrees to have this done through her gynecologist. She is going to call for appointment. -I also strongly recommend her to restart screening mammogram, given her family history of breast cancer. She agrees. I'll set up scan for her.  Plan -GI referral for colonoscopy every 1-2 years -she will call the gynecologist for hysterectomy -Screening mammogram -She will follow-up with her primary care physician, and  only see me on as-needed basis in the future.    All questions were answered. The patient knows to call the clinic with any problems, questions or concerns. I spent 55 minutes counseling the patient face to face. The total time spent in the appointment was 60 minutes and more than 50% was on counseling.     Truitt Merle, MD 09/05/2014 2:16 PM

## 2014-09-05 NOTE — Progress Notes (Signed)
Checked in new pt with no financial concerns. °

## 2014-09-06 ENCOUNTER — Telehealth: Payer: Self-pay | Admitting: Hematology

## 2014-09-06 NOTE — Telephone Encounter (Signed)
Unable to leave Voicemail (voicemail not set up) to inform patient Mammo is schedule for 04/29 @ 8. Per GI breast center patient will need to have reports faxed to [phone]) and the images will need to be mailed or brought in.

## 2014-09-08 ENCOUNTER — Encounter: Payer: Self-pay | Admitting: Internal Medicine

## 2014-09-08 ENCOUNTER — Ambulatory Visit (INDEPENDENT_AMBULATORY_CARE_PROVIDER_SITE_OTHER): Payer: 59 | Admitting: Internal Medicine

## 2014-09-08 ENCOUNTER — Telehealth: Payer: Self-pay | Admitting: Hematology

## 2014-09-08 VITALS — BP 110/70 | HR 64 | Ht 63.0 in | Wt 186.0 lb

## 2014-09-08 DIAGNOSIS — Z1509 Genetic susceptibility to other malignant neoplasm: Secondary | ICD-10-CM | POA: Diagnosis not present

## 2014-09-08 NOTE — Telephone Encounter (Signed)
Spoke with patient to confirm Mammo Appointment.

## 2014-09-08 NOTE — Progress Notes (Signed)
Leah Mason 11-05-1975 [id number]  Note: This dictation was prepared with Dragon digital system. Any transcriptional errors that result from this procedure are unintentional.   History of Present Illness: This is a  39 year old patient of Dr. Darron Mason, also followed by Dr Leah Mason  For HNPCC-4, or Lynch syndrome, positive PMS2 bene.. She is here to discuss screening colonoscopy. Prior colonoscopy was done around age 58 for low-volume rectal bleeding and it was a normal exam according to the patient. She said the exam was incomplete due to severe pain. Mother as well as patient's  sister are also positive for PMS2 gene.  Patient has been constipated. Tried prunes and high-fiber diet as well as fiber supplements.    Past Medical History  Diagnosis Date  . Seizures   . Family history of uterine cancer   . Family history of breast cancer   . Family history of Lynch syndrome   . Lynch syndrome   . PMS2-related Lynch syndrome [facility])     Past Surgical History  Procedure Laterality Date  . Cesarean section    . Cesarean section classical      No Known Allergies  Family history and social history have been reviewed.  Review of Systems:   [name] is negative except as outlined in the H&P  Physical Exam: General Appearance Well developed, in no distress, overweight Psychological Normal mood and affect  Assessment and Plan:    39 year old white female with the Lynch syndrome inherited from her mother. She is here to schedule screening colonoscopy. Last exam approximately 20 years ago before she knew she  had Lynch syndrome. We have discussed prep as well as sedation and the procedure. She will schedule, P:ropofol.. She is a high risk for colon cancer, beast cancer and GYn cancer.. Recall colonoscopies will be in one to 2 year intervals  Chronic constipation. Suggested high-fiber diet. Magnesium oxide or as an alternative Colace or magnesium  oxide.    Leah Mason 09/08/2014

## 2014-09-08 NOTE — Patient Instructions (Addendum)
You have been scheduled for a colonoscopy. Please follow written instructions given to you at your visit today.  Please pick up your prep supplies at the pharmacy within the next 1-3 days. If you use inhalers (even only as needed), please bring them with you on the day of your procedure. Your physician has requested that you go to www.startemmi.com and enter the access code given to you at your visit today. This web site gives a general overview about your procedure. However, you should still follow specific instructions given to you by our office regarding your preparation for the procedure.  Please purchase the following medications over the counter and take as directed: Magnesium oxide 400 mg-2 tablets daily OR Colace 1 tablet daily OR Senokot 2 tablets every night  Dr Nadyne CoombesKaren Richter is now practicing with "[employer] [facility]". Their information is as follows: Call Our Office: Register by calling our office and speaking with our registration team. To expedite the process, please have your insurance information handy when you call. Toll Free [phone] or [phone]. Dr Mosetta PuttFeng, Oncology

## 2014-09-15 ENCOUNTER — Ambulatory Visit: Payer: 59

## 2014-09-20 ENCOUNTER — Encounter: Payer: 59 | Admitting: Internal Medicine

## 2014-09-20 ENCOUNTER — Ambulatory Visit
Admission: RE | Admit: 2014-09-20 | Discharge: 2014-09-20 | Disposition: A | Discharge status: Home / Self Care | Payer: 59 | Source: Ambulatory Visit | Attending: Hematology | Admitting: Hematology

## 2014-09-20 DIAGNOSIS — Z1509 Genetic susceptibility to other malignant neoplasm: Secondary | ICD-10-CM

## 2014-09-21 ENCOUNTER — Encounter: Payer: Self-pay | Admitting: Internal Medicine

## 2014-09-21 ENCOUNTER — Other Ambulatory Visit: Payer: Self-pay | Admitting: Hematology

## 2014-09-21 DIAGNOSIS — R928 Other abnormal and inconclusive findings on diagnostic imaging of breast: Secondary | ICD-10-CM

## 2014-09-26 ENCOUNTER — Ambulatory Visit
Admission: RE | Admit: 2014-09-26 | Discharge: 2014-09-26 | Disposition: A | Discharge status: Home / Self Care | Payer: 59 | Source: Ambulatory Visit | Attending: Hematology | Admitting: Hematology

## 2014-09-26 ENCOUNTER — Encounter (INDEPENDENT_AMBULATORY_CARE_PROVIDER_SITE_OTHER): Payer: Self-pay

## 2014-09-26 DIAGNOSIS — R928 Other abnormal and inconclusive findings on diagnostic imaging of breast: Secondary | ICD-10-CM

## 2014-10-02 ENCOUNTER — Telehealth: Payer: Self-pay | Admitting: Internal Medicine

## 2014-10-02 NOTE — Telephone Encounter (Signed)
Spoke with patient and told her care partner has to be here or we will not start to get her ready for procedure.

## 2014-10-04 ENCOUNTER — Ambulatory Visit (AMBULATORY_SURGERY_CENTER): Payer: 59 | Admitting: Internal Medicine

## 2014-10-04 ENCOUNTER — Encounter: Payer: Self-pay | Admitting: Internal Medicine

## 2014-10-04 VITALS — BP 107/61 | HR 57 | Temp 98.3°F | Resp 26 | Ht 63.0 in | Wt 186.0 lb

## 2014-10-04 DIAGNOSIS — Z1509 Genetic susceptibility to other malignant neoplasm: Secondary | ICD-10-CM | POA: Diagnosis not present

## 2014-10-04 DIAGNOSIS — Z1211 Encounter for screening for malignant neoplasm of colon: Secondary | ICD-10-CM

## 2014-10-04 MED ORDER — SODIUM CHLORIDE 0.9 % IV SOLN: 500.0000 mL | INTRAVENOUS | Status: DC

## 2014-10-04 NOTE — Progress Notes (Signed)
Stable to RR 

## 2014-10-04 NOTE — Op Note (Addendum)
[place]  [address] [place], [id number]   COLONOSCOPY PROCEDURE REPORT  PATIENT: Leah, Mason  MR#: [id number] BIRTHDATE: 30-Mar-1976 , 38  yrs. old GENDER: female ENDOSCOPIST: Lafayette Dragon, MD REFERRED BY:Dr Feng PROCEDURE DATE:  10/04/2014 PROCEDURE:   Colonoscopy, screening First Screening Colonoscopy - Avg.  risk and is 50 yrs.  old or older Yes.  Prior Negative Screening - Now for repeat screening. N/A  History of Adenoma - Now for follow-up colonoscopy & has been > or = to 3 yrs.  N/A  Polyps removed today? No Recommend repeat exam, <10 yrs? Yes high risk ASA CLASS:   Class I INDICATIONS:HNPCC-4, Lynch syndrome, Positive PMS2, had negative colonoscopy at age 37. MEDICATIONS: Monitored anesthesia care and Propofol 400 mg IV  DESCRIPTION OF PROCEDURE:   After the risks benefits and alternatives of the procedure were thoroughly explained, informed consent was obtained.  The digital rectal exam revealed no abnormalities of the rectum.   The LB [id number]-H190 [id number]  endoscope was introduced through the anus and advanced to the cecum, which was identified by both the appendix and ileocecal valve. No adverse events experienced.   The quality of the prep was good.  (MoviPrep was used)  The instrument was then slowly withdrawn as the colon was fully examined. Estimated blood loss is zero unless otherwise noted in this procedure report.      COLON FINDINGS: A normal appearing cecum, ileocecal valve, and appendiceal orifice were identified.  The ascending, transverse, descending, sigmoid colon, and rectum appeared unremarkable. Retroflexed views revealed no abnormalities. The time to cecum = 8.11 Withdrawal time = 8.06   The scope was withdrawn and the procedure completed. COMPLICATIONS: There were no immediate complications.  ENDOSCOPIC IMPRESSION: Normal colonoscopy  RECOMMENDATIONS: High-fiber diet Recall colonoscopy in 2 years  eSigned:  Lafayette Dragon, MD 10/04/2014 4:38 PM Revised: 10/04/2014 4:38 PM  cc Dr Horald Pollen

## 2014-10-04 NOTE — Patient Instructions (Signed)
FOLLOW DISCHARGE INSTRUCTIONS (BLUE AND GREEN SHEETS).YOU HAD AN ENDOSCOPIC PROCEDURE TODAY AT [facility]:   Refer to the procedure report that was given to you for any specific questions about what was found during the examination.  If the procedure report does not answer your questions, please call your gastroenterologist to clarify.  If you requested that your care partner not be given the details of your procedure findings, then the procedure report has been included in a sealed envelope for you to review at your convenience later.  YOU SHOULD EXPECT: Some feelings of bloating in the abdomen. Passage of more gas than usual.  Walking can help get rid of the air that was put into your GI tract during the procedure and reduce the bloating. If you had a lower endoscopy (such as a colonoscopy or flexible sigmoidoscopy) you may notice spotting of blood in your stool or on the toilet paper. If you underwent a bowel prep for your procedure, you may not have a normal bowel movement for a few days.  Please Note:  You might notice some irritation and congestion in your nose or some drainage.  This is from the oxygen used during your procedure.  There is no need for concern and it should clear up in a day or so.  SYMPTOMS TO REPORT IMMEDIATELY:   Following lower endoscopy (colonoscopy or flexible sigmoidoscopy):  Excessive amounts of blood in the stool  Significant tenderness or worsening of abdominal pains  Swelling of the abdomen that is new, acute  Fever of 100F or higher   Following upper endoscopy (EGD)  Vomiting of blood or coffee ground material  New chest pain or pain under the shoulder blades  Painful or persistently difficult swallowing  New shortness of breath  Fever of 100F or higher  Black, tarry-looking stools  For urgent or emergent issues, a gastroenterologist can be reached at any hour by calling [phone].   DIET: Your first meal following the procedure  should be a small meal and then it is ok to progress to your normal diet. Heavy or fried foods are harder to digest and may make you feel nauseous or bloated.  Likewise, meals heavy in dairy and vegetables can increase bloating.  Drink plenty of fluids but you should avoid alcoholic beverages for 24 hours.  ACTIVITY:  You should plan to take it easy for the rest of today and you should NOT DRIVE or use heavy machinery until tomorrow (because of the sedation medicines used during the test).    FOLLOW UP: Our staff will call the number listed on your records the next business day following your procedure to check on you and address any questions or concerns that you may have regarding the information given to you following your procedure. If we do not reach you, we will leave a message.  However, if you are feeling well and you are not experiencing any problems, there is no need to return our call.  We will assume that you have returned to your regular daily activities without incident.  If any biopsies were taken you will be contacted by phone or by letter within the next 1-3 weeks.  Please call us at [phone] if you have not heard about the biopsies in 3 weeks.    SIGNATURES/CONFIDENTIALITY: You and/or your care partner have signed paperwork which will be entered into your electronic medical record.  These signatures attest to the fact that that the information above on your  After Visit Summary has been reviewed and is understood.  Full responsibility of the confidentiality of this discharge information lies with you and/or your care-partner.  Normal colonoscopy- Recall 2 years-2018  High fiber diet information given.

## 2014-10-05 ENCOUNTER — Telehealth: Payer: Self-pay | Admitting: *Deleted

## 2014-10-05 ENCOUNTER — Ambulatory Visit: Payer: 59

## 2014-10-05 NOTE — Telephone Encounter (Signed)
  Follow up Call-  Call back number 10/04/2014  Post procedure Call Back phone  # [phone]  Permission to leave phone message Yes     Patient questions:  Do you have a fever, pain , or abdominal swelling? No. Pain Score  0 *  Have you tolerated food without any problems? Yes.    Have you been able to return to your normal activities? Yes.    Do you have any questions about your discharge instructions: Diet   No. Medications  No. Follow up visit  No.  Do you have questions or concerns about your Care? No.  Actions: * If pain score is 4 or above: No action needed, pain <4.

## 2014-10-10 ENCOUNTER — Telehealth: Payer: Self-pay | Admitting: *Deleted

## 2014-10-10 NOTE — Telephone Encounter (Signed)
Received call back from patient, re: needed to reschedule her FU. She states she has "had no problems since she last saw Dr [name]". She wants to cancel her appointment and states she will call back if she needs to. Patient thanked this caller for call.

## 2014-10-11 NOTE — Telephone Encounter (Signed)
Ok do not schedule routine f/u and patient may call in future and make appointment

## 2014-11-06 ENCOUNTER — Ambulatory Visit: Payer: 59 | Admitting: Neurology

## 2015-04-18 ENCOUNTER — Inpatient Hospital Stay (HOSPITAL_COMMUNITY)
Admission: RE | Admit: 2015-04-18 | Discharge: 2015-04-18 | Disposition: A | Discharge status: Home / Self Care | Payer: 59 | Source: Ambulatory Visit

## 2015-04-18 NOTE — Patient Instructions (Signed)
Your procedure is scheduled on:  Wednesday, Dec. 14, 2016  [facility]:  7:00 A.M.  Pick up the phone at the desk and dial [phone].  Call this number if you have problems the morning of surgery: [phone].  Remember: Do NOT eat food or drink after: Midnight Tuesday, Dec. 13, 2016  Take these medicines the morning of surgery with a SIP OF WATER:  None  Do NOT wear jewelry (body piercing), metal hair clips/bobby pins, make-up, or nail polish. Do NOT wear lotions, powders, or perfumes.  You may wear deoderant. Do NOT shave for 48 hours prior to surgery. Do NOT bring valuables to the hospital. Contacts, dentures, or bridgework may not be worn into surgery. Leave suitcase in car.  After surgery it may be brought to your room.  For patients admitted to the hospital, checkout time is 11:00 AM the day of discharge.

## 2015-04-23 ENCOUNTER — Other Ambulatory Visit: Payer: Self-pay | Admitting: Obstetrics and Gynecology

## 2015-04-23 NOTE — H&P (Signed)
Chief Complaint(s):   PreOp History and Physical for 05/02/15   HPI:  General 39 -year-old presents for preop history and physical exam in preparation for robotic assisted laparoscopic hysterectomy. she has a lynch syndrome genetic mutation that puts her at risk for endometrial cancer. she is sent for consultation by Dr. Thurnell Lose.  Endometrial biopsy was done to rule out any precancerous changes. Pathology results were normal. Ultrasound showed a normal sized uterus endometrium normal. Her uterus measures 8.8 cm x 5.3 cm x 4.5 cm. Her ovaries appeared normal. she desires robotic hysterectomy with bilateral salpinoophorectomy.  Current Medication:   None   Medical History:   PMS2 Gene mutation-At risk for Lynch syndrome   Allergies/Intolerance:   N.K.D.A.   Gyn History:   Sexual activity currently sexually active. Periods : every month. LMP 04/22/15. Birth control BTL. Last pap smear date 03/2014. Last mammogram date 10/04/14. Denies Abnormal pap smear. Denies STD. GYN procedures Endometrial Biopsy, 10/09/14, benign.   OB History:   Number of pregnancies 3. Pregnancy # 1 live birth, C-section delivery. Pregnancy # 2 live birth, C-section. Pregnancy # 3 live birth, C-section.   Surgical History:   C-section x 3     BTL with C-scetion #3   Hospitalization:   childbirth x 3   Family History:   Father: alive    Mother: alive, Breast cancer, Uterine cancer, diagnosed with Breast Ca  Social History:  General Tobacco use cigarettes: Never smoked, Tobacco history last updated 04/23/2015.  no EXPOSURE TO PASSIVE SMOKE.  no Alcohol.  Caffeine: yes, coffee.  no Recreational drug use.  no Exercise.  Marital Status: single.  Children: girls, 2, Boys, 1.  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 .  ROS: Negative except as stated in HPI.   Objective:  Vitals:  Wt 193, Wt change 1 lb, Pulse sitting 57, BP sitting 137/87  Past Results:    Examination:  General Examination alert, oriented, NAD" categoryPropId="[id number]" examid="[id number]"GENERAL APPEARANCE alert, oriented, NAD.  McCoy,Tiffany 04/23/2015 11:44:49 AM &gt; , for female genital exam" categoryPropId="[id number]" examid="[id number]"CHAPERONE PRESENT McCoy,Tiffany 04/23/2015 11:44:49 AM > , for female genital exam.  moist, warm" categoryPropId="10109" examid="[id number]"SKIN: moist, warm.  clear to auscultation bilaterally" categoryPropId="87" examid="[id number]"LUNGS: clear to auscultation bilaterally.  regular rate and rhythm" categoryPropId="86" examid="[id number]"HEART: regular rate and rhythm.  soft, non-tender/non-distended, bowel sounds present" categoryPropId="88" examid="[id number]"ABDOMEN: soft, non-tender/non-distended, bowel sounds present.  normal external genitalia, labia - unremarkable, vagina - pink moist mucosa, no lesions or abnormal discharge, cervix - no discharge or lesions or CMT, adnexa - no masses or tenderness, uterus - nontender and normal size on palpation" categoryPropId="13414" examid="[id number]"FEMALE GENITOURINARY: normal external genitalia, labia - unremarkable, vagina - pink moist mucosa, no lesions or abnormal discharge, cervix - no discharge or lesions or CMT, adnexa - no masses or tenderness, uterus - nontender and normal size on palpation.  normal range of motion all joints" categoryPropId="13767" examid="[id number]"MUSCULOSKELETAL normal range of motion all joints.  no edema present" categoryPropId="89" examid="[id number]"EXTREMITIES: no edema present.  affect normal, good eye contact" categoryPropId="16316" examid="[id number]"PSYCH affect normal, good eye contact.  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  r/b/a of surgery were discussed including but not limited to infection, bleeding , damage to bowel bladder ureters with the need for further  surgery. r/o conversion to open approach discussed.. r/o transfusion / HIV/ hep B&C discussed. pt voiced understanding and desires to proceed with the above surgery.

## 2015-04-25 ENCOUNTER — Encounter (HOSPITAL_COMMUNITY): Payer: Self-pay

## 2015-04-25 ENCOUNTER — Encounter (HOSPITAL_COMMUNITY)
Admission: RE | Admit: 2015-04-25 | Discharge: 2015-04-25 | Disposition: A | Discharge status: Home / Self Care | Payer: 59 | Source: Ambulatory Visit | Attending: Obstetrics and Gynecology | Admitting: Obstetrics and Gynecology

## 2015-04-25 DIAGNOSIS — Z1509 Genetic susceptibility to other malignant neoplasm: Secondary | ICD-10-CM | POA: Diagnosis not present

## 2015-04-25 DIAGNOSIS — Z1504 Genetic susceptibility to malignant neoplasm of endometrium: Secondary | ICD-10-CM | POA: Diagnosis not present

## 2015-04-25 DIAGNOSIS — Z01818 Encounter for other preprocedural examination: Secondary | ICD-10-CM | POA: Diagnosis present

## 2015-04-25 LAB — TYPE AND SCREEN
ABO/RH(D): A POS
Antibody Screen: NEGATIVE

## 2015-04-25 LAB — CBC
HCT: 38.8 % (ref 36.0–46.0)
Hemoglobin: 13 g/dL (ref 12.0–15.0)
MCH: 29.9 pg (ref 26.0–34.0)
MCHC: 33.5 g/dL (ref 30.0–36.0)
MCV: 89.2 fL (ref 78.0–100.0)
Platelets: 191 10*3/uL (ref 150–400)
RBC: 4.35 MIL/uL (ref 3.87–5.11)
RDW: 12.6 % (ref 11.5–15.5)
WBC: 5.9 10*3/uL (ref 4.0–10.5)

## 2015-04-25 LAB — ABO/RH: ABO/RH(D): A POS

## 2015-04-25 NOTE — Patient Instructions (Signed)
Your procedure is scheduled on: May 02, 2015   [facility] at: 7:00 am   Pick up the phone at the desk and dial [phone].  Call this number if you have problems the morning of surgery: [phone].  Remember: Do NOT eat food: after midnight on Tuesday  Do NOT drink clear liquids after: midnight on Tuesday  Take these medicines the morning of surgery with a SIP OF WATER: none   Do NOT wear jewelry (body piercing), metal hair clips/bobby pins, make-up, or nail polish.  Do NOT wear lotions, powders, or perfumes.  You may wear deoderant. Do NOT shave for 48 hours prior to surgery. Do NOT bring valuables to the hospital. Contacts, dentures, or bridgework may not be worn into surgery. Leave suitcase in car.  After surgery it may be brought to your room.  For patients admitted to the hospital, checkout time is 11:00 AM the day of discharge.

## 2015-04-30 ENCOUNTER — Encounter (HOSPITAL_COMMUNITY): Payer: Self-pay | Admitting: Anesthesiology

## 2015-04-30 NOTE — Anesthesia Preprocedure Evaluation (Addendum)
Anesthesia Evaluation  Patient identified by MRN, date of birth, ID band Patient awake    Reviewed: Allergy & Precautions, NPO status , Patient's Chart, lab work & pertinent test results  History of Anesthesia Complications Negative for: history of anesthetic complications  Airway Mallampati: II  TM Distance: >3 FB Neck ROM: Full    Dental no notable dental hx. (+) Dental Advisory Given   Pulmonary neg pulmonary ROS,    Pulmonary exam normal breath sounds clear to auscultation       Cardiovascular negative cardio ROS Normal cardiovascular exam Rhythm:Regular Rate:Normal     Neuro/Psych Seizures -, Well Controlled,  Dysarthria negative psych ROS   GI/Hepatic negative GI ROS, Neg liver ROS,   Endo/Other  Obesity  Renal/GU negative Renal ROS     Musculoskeletal negative musculoskeletal ROS (+)   Abdominal   Peds  Hematology negative hematology ROS (+)   Anesthesia Other Findings   Reproductive/Obstetrics Family Hx/o Uterine Ca Family Hx/o Lynch syndrome                            Anesthesia Physical Anesthesia Plan  ASA: II  Anesthesia Plan: General   Post-op Pain Management:    Induction: Intravenous  Airway Management Planned: Oral ETT  Additional Equipment:   Intra-op Plan:   Post-operative Plan: Extubation in OR  Informed Consent: I have reviewed the patients History and Physical, chart, labs and discussed the procedure including the risks, benefits and alternatives for the proposed anesthesia with the patient or authorized representative who has indicated his/her understanding and acceptance.   Dental advisory given  Plan Discussed with: CRNA, Anesthesiologist and Surgeon  Anesthesia Plan Comments:         Anesthesia Quick Evaluation

## 2015-05-01 ENCOUNTER — Other Ambulatory Visit: Payer: Self-pay | Admitting: Obstetrics and Gynecology

## 2015-05-01 MED ORDER — DEXTROSE 5 % IV SOLN
2.0000 g | INTRAVENOUS | Status: AC
  Administered 2015-05-02: 2 g via INTRAVENOUS
  Filled 2015-05-01: qty 2

## 2015-05-01 NOTE — H&P (Signed)
Chief Complaint(s):   PreOp History and Physical for 05/02/15   HPI:  General 39 -year-old presents for preop history and physical exam in preparation for robotic assisted laparoscopic hysterectomy. she has a lynch syndrome genetic mutation that puts her at risk for endometrial cancer. she is sent for consultation by Dr. Thurnell Lose.  Endometrial biopsy was done to rule out any precancerous changes. Pathology results were normal. Ultrasound showed a normal sized uterus endometrium normal. Her uterus measures 8.8 cm x 5.3 cm x 4.5 cm. Her ovaries appeared normal. she desires robotic hysterectomy with bilateral salpinoophorectomy.  Current Medication:   None   Medical History:   PMS2 Gene mutation-At risk for Lynch syndrome   Allergies/Intolerance:   N.K.D.A.   Gyn History:   Sexual activity currently sexually active. Periods : every month. LMP 04/22/15. Birth control BTL. Last pap smear date 03/2014. Last mammogram date 10/04/14. Denies Abnormal pap smear. Denies STD. GYN procedures Endometrial Biopsy, 10/09/14, benign.   OB History:   Number of pregnancies 3. Pregnancy # 1 live birth, C-section delivery. Pregnancy # 2 live birth, C-section. Pregnancy # 3 live birth, C-section.   Surgical History:   C-section x 3     BTL with C-scetion #3   Hospitalization:   childbirth x 3   Family History:   Father: alive    Mother: alive, Breast cancer, Uterine cancer, diagnosed with Breast Ca  Social History:  General Tobacco use cigarettes: Never smoked, Tobacco history last updated 04/23/2015.  no EXPOSURE TO PASSIVE SMOKE.  no Alcohol.  Caffeine: yes, coffee.  no Recreational drug use.  no Exercise.  Marital Status: single.  Children: girls, 2, Boys, 1.  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 .  ROS: Negative except as stated in HPI.   Objective:  Vitals:  Wt 193, Wt change 1 lb, Pulse sitting 57, BP sitting 137/87  Past Results:    Examination:  General Examination alert, oriented, NAD" label="GENERAL APPEARANCE" categoryPropId="10089" examid="[id number]"GENERAL APPEARANCE alert, oriented, NAD.  McCoy,Tiffany 04/23/2015 11:44:49 AM &gt; , for female genital exam" label="CHAPERONE PRESENT" categoryPropId="21620" examid="[id number]"CHAPERONE PRESENT McCoy,Tiffany 04/23/2015 11:44:49 AM > , for female genital exam.  moist, warm" label="SKIN:" categoryPropId="10109" examid="[id number]"SKIN: moist, warm.  clear to auscultation bilaterally" label="LUNGS:" categoryPropId="87" examid="[id number]"LUNGS: clear to auscultation bilaterally.  regular rate and rhythm" label="HEART:" categoryPropId="86" examid="[id number]"HEART: regular rate and rhythm.  soft, non-tender/non-distended, bowel sounds present" label="ABDOMEN:" categoryPropId="88" examid="[id number]"ABDOMEN: soft, non-tender/non-distended, bowel sounds present.  normal external genitalia, labia - unremarkable, vagina - pink moist mucosa, no lesions or abnormal discharge, cervix - no discharge or lesions or CMT, adnexa - no masses or tenderness, uterus - nontender and normal size on palpation" label="FEMALE GENITOURINARY:" categoryPropId="13414" examid="[id number]"FEMALE GENITOURINARY: normal external genitalia, labia - unremarkable, vagina - pink moist mucosa, no lesions or abnormal discharge, cervix - no discharge or lesions or CMT, adnexa - no masses or tenderness, uterus - nontender and normal size on palpation.  normal range of motion all joints" label="MUSCULOSKELETAL" categoryPropId="13767" examid="[id number]"MUSCULOSKELETAL normal range of motion all joints.  no edema present" label="EXTREMITIES:" categoryPropId="89" examid="[id number]"EXTREMITIES: no edema present.  affect normal, good eye contact" label="PSYCH" categoryPropId="16316" examid="[id number]"PSYCH affect normal, good eye contact.  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 r/b/a of surgery were discussed including but not limited to  infection, bleeding , damage to bowel bladder ureters with the need for further surgery. r/o conversion to open approach discussed.. r/o transfusion / HIV/ hep B&C discussed. pt voiced understanding and desires to proceed with the above surgery.

## 2015-05-02 ENCOUNTER — Encounter (HOSPITAL_COMMUNITY)
Admission: AD | Disposition: A | Discharge status: Home / Self Care | Payer: Self-pay | Source: Ambulatory Visit | Attending: Obstetrics and Gynecology

## 2015-05-02 ENCOUNTER — Inpatient Hospital Stay (HOSPITAL_COMMUNITY)
Admission: AD | Admit: 2015-05-02 | Discharge: 2015-05-03 | DRG: 743 | Disposition: A | Discharge status: Home / Self Care | Payer: 59 | Source: Ambulatory Visit | Attending: Obstetrics and Gynecology | Admitting: Obstetrics and Gynecology

## 2015-05-02 ENCOUNTER — Ambulatory Visit (HOSPITAL_COMMUNITY): Payer: 59 | Admitting: Anesthesiology

## 2015-05-02 ENCOUNTER — Encounter (HOSPITAL_COMMUNITY): Payer: Self-pay | Admitting: *Deleted

## 2015-05-02 DIAGNOSIS — Z4002 Encounter for prophylactic removal of ovary: Principal | ICD-10-CM | POA: Diagnosis present

## 2015-05-02 DIAGNOSIS — Z4009 Encounter for prophylactic removal of other organ: Secondary | ICD-10-CM | POA: Diagnosis present

## 2015-05-02 DIAGNOSIS — E669 Obesity, unspecified: Secondary | ICD-10-CM | POA: Diagnosis present

## 2015-05-02 DIAGNOSIS — Z1509 Genetic susceptibility to other malignant neoplasm: Secondary | ICD-10-CM | POA: Diagnosis not present

## 2015-05-02 DIAGNOSIS — K660 Peritoneal adhesions (postprocedural) (postinfection): Secondary | ICD-10-CM | POA: Diagnosis present

## 2015-05-02 DIAGNOSIS — Z6834 Body mass index (BMI) 34.0-34.9, adult: Secondary | ICD-10-CM

## 2015-05-02 DIAGNOSIS — Z1504 Genetic susceptibility to malignant neoplasm of endometrium: Secondary | ICD-10-CM | POA: Diagnosis not present

## 2015-05-02 DIAGNOSIS — Z90710 Acquired absence of both cervix and uterus: Secondary | ICD-10-CM | POA: Diagnosis present

## 2015-05-02 DIAGNOSIS — Z1379 Encounter for other screening for genetic and chromosomal anomalies: Secondary | ICD-10-CM

## 2015-05-02 HISTORY — PX: SALPINGOOPHORECTOMY: SHX82

## 2015-05-02 HISTORY — PX: ROBOTIC ASSISTED TOTAL HYSTERECTOMY WITH BILATERAL SALPINGO OOPHERECTOMY: SHX6086

## 2015-05-02 HISTORY — PX: ROBOTIC ASSISTED LAPAROSCOPIC LYSIS OF ADHESION: SHX6080

## 2015-05-02 SURGERY — ROBOTIC ASSISTED TOTAL HYSTERECTOMY WITH BILATERAL SALPINGO OOPHORECTOMY
Anesthesia: General | Site: Abdomen

## 2015-05-02 MED ORDER — PROPOFOL 10 MG/ML IV BOLUS
INTRAVENOUS | Status: DC | PRN
  Administered 2015-05-02: 150 mg via INTRAVENOUS
  Administered 2015-05-02: 50 mg via INTRAVENOUS

## 2015-05-02 MED ORDER — MIDAZOLAM HCL 2 MG/2ML IJ SOLN
INTRAMUSCULAR | Status: DC | PRN
  Administered 2015-05-02: 2 mg via INTRAVENOUS

## 2015-05-02 MED ORDER — DEXAMETHASONE SODIUM PHOSPHATE 4 MG/ML IJ SOLN
INTRAMUSCULAR | Status: AC
  Filled 2015-05-02: qty 1

## 2015-05-02 MED ORDER — STERILE WATER FOR IRRIGATION IR SOLN
Status: DC | PRN
  Administered 2015-05-02: 1000 mL via INTRAVESICAL

## 2015-05-02 MED ORDER — GLYCOPYRROLATE 0.2 MG/ML IJ SOLN
INTRAMUSCULAR | Status: AC
  Filled 2015-05-02: qty 3

## 2015-05-02 MED ORDER — MENTHOL 3 MG MT LOZG: 1.0000 | LOZENGE | OROMUCOSAL | Status: DC | PRN

## 2015-05-02 MED ORDER — FENTANYL CITRATE (PF) 100 MCG/2ML IJ SOLN
INTRAMUSCULAR | Status: DC | PRN
  Administered 2015-05-02: 50 ug via INTRAVENOUS
  Administered 2015-05-02 (×2): 100 ug via INTRAVENOUS
  Administered 2015-05-02: 50 ug via INTRAVENOUS
  Administered 2015-05-02: 100 ug via INTRAVENOUS

## 2015-05-02 MED ORDER — SCOPOLAMINE 1 MG/3DAYS TD PT72
1.0000 | MEDICATED_PATCH | Freq: Once | TRANSDERMAL | Status: DC
  Administered 2015-05-02: 1.5 mg via TRANSDERMAL

## 2015-05-02 MED ORDER — SCOPOLAMINE 1 MG/3DAYS TD PT72
MEDICATED_PATCH | TRANSDERMAL | Status: AC
  Administered 2015-05-02: 1.5 mg via TRANSDERMAL
  Filled 2015-05-02: qty 1

## 2015-05-02 MED ORDER — HYDROMORPHONE HCL 1 MG/ML IJ SOLN
0.2500 mg | INTRAMUSCULAR | Status: DC | PRN
  Administered 2015-05-02 (×4): 0.5 mg via INTRAVENOUS

## 2015-05-02 MED ORDER — SODIUM CHLORIDE 0.9 % IV SOLN
INTRAVENOUS | Status: DC | PRN
  Administered 2015-05-02: 10 mL
  Administered 2015-05-02: 100 mL

## 2015-05-02 MED ORDER — OXYCODONE-ACETAMINOPHEN 5-325 MG PO TABS
1.0000 | ORAL_TABLET | ORAL | Status: DC | PRN
  Administered 2015-05-03: 1 via ORAL
  Filled 2015-05-02: qty 1

## 2015-05-02 MED ORDER — HYDROMORPHONE HCL 1 MG/ML IJ SOLN
INTRAMUSCULAR | Status: AC
  Filled 2015-05-02: qty 1

## 2015-05-02 MED ORDER — ONDANSETRON HCL 4 MG/2ML IJ SOLN
INTRAMUSCULAR | Status: DC | PRN
  Administered 2015-05-02: 4 mg via INTRAVENOUS

## 2015-05-02 MED ORDER — ARTIFICIAL TEARS OP OINT
TOPICAL_OINTMENT | OPHTHALMIC | Status: DC | PRN
Start: 2015-05-02 — End: 2015-05-02
  Administered 2015-05-02: 1 via OPHTHALMIC

## 2015-05-02 MED ORDER — KETOROLAC TROMETHAMINE 30 MG/ML IJ SOLN: 30.0000 mg | Freq: Four times a day (QID) | INTRAMUSCULAR | Status: DC

## 2015-05-02 MED ORDER — SODIUM CHLORIDE 0.9 % IJ SOLN
INTRAMUSCULAR | Status: AC
  Filled 2015-05-02: qty 50

## 2015-05-02 MED ORDER — BELLADONNA ALKALOIDS-OPIUM 16.2-60 MG RE SUPP
1.0000 | Freq: Once | RECTAL | Status: AC
  Administered 2015-05-02: 1 via RECTAL
  Filled 2015-05-02: qty 1

## 2015-05-02 MED ORDER — HYDROMORPHONE HCL 1 MG/ML IJ SOLN: 0.2000 mg | INTRAMUSCULAR | Status: DC | PRN

## 2015-05-02 MED ORDER — ONDANSETRON HCL 4 MG/2ML IJ SOLN
4.0000 mg | Freq: Four times a day (QID) | INTRAMUSCULAR | Status: DC | PRN
  Administered 2015-05-02: 4 mg via INTRAVENOUS
  Filled 2015-05-02 (×2): qty 2

## 2015-05-02 MED ORDER — ROCURONIUM BROMIDE 100 MG/10ML IV SOLN
INTRAVENOUS | Status: AC
  Filled 2015-05-02: qty 1

## 2015-05-02 MED ORDER — IBUPROFEN 800 MG PO TABS
800.0000 mg | ORAL_TABLET | Freq: Three times a day (TID) | ORAL | Status: DC | PRN
  Administered 2015-05-03: 800 mg via ORAL
  Filled 2015-05-02: qty 1

## 2015-05-02 MED ORDER — LIDOCAINE HCL (CARDIAC) 20 MG/ML IV SOLN
INTRAVENOUS | Status: DC | PRN
  Administered 2015-05-02: 80 mg via INTRAVENOUS

## 2015-05-02 MED ORDER — PANTOPRAZOLE SODIUM 40 MG IV SOLR
40.0000 mg | Freq: Every day | INTRAVENOUS | Status: DC
  Administered 2015-05-02: 40 mg via INTRAVENOUS
  Filled 2015-05-02 (×2): qty 40

## 2015-05-02 MED ORDER — METHYLENE BLUE 1 % INJ SOLN
INTRAMUSCULAR | Status: DC | PRN
  Administered 2015-05-02: 1 mL

## 2015-05-02 MED ORDER — METOCLOPRAMIDE HCL 5 MG/ML IJ SOLN: 10.0000 mg | Freq: Once | INTRAMUSCULAR | Status: DC | PRN

## 2015-05-02 MED ORDER — MEPERIDINE HCL 25 MG/ML IJ SOLN: 6.2500 mg | INTRAMUSCULAR | Status: DC | PRN

## 2015-05-02 MED ORDER — ALUM & MAG HYDROXIDE-SIMETH 200-200-20 MG/5ML PO SUSP: 30.0000 mL | ORAL | Status: DC | PRN

## 2015-05-02 MED ORDER — SODIUM CHLORIDE 0.9 % IJ SOLN
INTRAMUSCULAR | Status: AC
  Filled 2015-05-02: qty 10

## 2015-05-02 MED ORDER — LACTATED RINGERS IV SOLN
INTRAVENOUS | Status: DC
  Administered 2015-05-02 – 2015-05-03 (×2): via INTRAVENOUS

## 2015-05-02 MED ORDER — METHYLENE BLUE 1 % INJ SOLN
INTRAMUSCULAR | Status: AC
Start: 2015-05-02 — End: 2015-05-02
  Filled 2015-05-02: qty 1

## 2015-05-02 MED ORDER — LACTATED RINGERS IV SOLN
INTRAVENOUS | Status: DC
  Administered 2015-05-02 (×5): via INTRAVENOUS

## 2015-05-02 MED ORDER — MIDAZOLAM HCL 2 MG/2ML IJ SOLN
INTRAMUSCULAR | Status: AC
  Filled 2015-05-02: qty 2

## 2015-05-02 MED ORDER — SIMETHICONE 80 MG PO CHEW: 80.0000 mg | CHEWABLE_TABLET | Freq: Four times a day (QID) | ORAL | Status: DC | PRN

## 2015-05-02 MED ORDER — FENTANYL CITRATE (PF) 100 MCG/2ML IJ SOLN
INTRAMUSCULAR | Status: AC
  Filled 2015-05-02: qty 2

## 2015-05-02 MED ORDER — LIDOCAINE HCL (CARDIAC) 20 MG/ML IV SOLN
INTRAVENOUS | Status: AC
  Filled 2015-05-02: qty 5

## 2015-05-02 MED ORDER — ONDANSETRON HCL 4 MG PO TABS: 4.0000 mg | ORAL_TABLET | Freq: Four times a day (QID) | ORAL | Status: DC | PRN

## 2015-05-02 MED ORDER — KETOROLAC TROMETHAMINE 30 MG/ML IJ SOLN
INTRAMUSCULAR | Status: DC | PRN
  Administered 2015-05-02: 30 mg via INTRAVENOUS

## 2015-05-02 MED ORDER — GLYCOPYRROLATE 0.2 MG/ML IJ SOLN
INTRAMUSCULAR | Status: DC | PRN
  Administered 2015-05-02: 0.6 mg via INTRAVENOUS

## 2015-05-02 MED ORDER — PROPOFOL 10 MG/ML IV BOLUS
INTRAVENOUS | Status: AC
  Filled 2015-05-02: qty 20

## 2015-05-02 MED ORDER — ROPIVACAINE HCL 5 MG/ML IJ SOLN
INTRAMUSCULAR | Status: AC
  Filled 2015-05-02: qty 60

## 2015-05-02 MED ORDER — ROCURONIUM BROMIDE 100 MG/10ML IV SOLN
INTRAVENOUS | Status: DC | PRN
  Administered 2015-05-02: 10 mg via INTRAVENOUS
  Administered 2015-05-02: 40 mg via INTRAVENOUS
  Administered 2015-05-02: 30 mg via INTRAVENOUS
  Administered 2015-05-02 (×2): 20 mg via INTRAVENOUS

## 2015-05-02 MED ORDER — NEOSTIGMINE METHYLSULFATE 10 MG/10ML IV SOLN
INTRAVENOUS | Status: DC | PRN
  Administered 2015-05-02: 4 mg via INTRAVENOUS

## 2015-05-02 MED ORDER — SENNA 8.6 MG PO TABS
1.0000 | ORAL_TABLET | Freq: Two times a day (BID) | ORAL | Status: DC
  Administered 2015-05-02 – 2015-05-03 (×2): 8.6 mg via ORAL
  Filled 2015-05-02 (×7): qty 1

## 2015-05-02 MED ORDER — NEOSTIGMINE METHYLSULFATE 10 MG/10ML IV SOLN
INTRAVENOUS | Status: AC
  Filled 2015-05-02: qty 1

## 2015-05-02 MED ORDER — DEXAMETHASONE SODIUM PHOSPHATE 10 MG/ML IJ SOLN
INTRAMUSCULAR | Status: DC | PRN
  Administered 2015-05-02: 4 mg via INTRAVENOUS

## 2015-05-02 MED ORDER — KETOROLAC TROMETHAMINE 30 MG/ML IJ SOLN
30.0000 mg | Freq: Four times a day (QID) | INTRAMUSCULAR | Status: DC
  Administered 2015-05-02 – 2015-05-03 (×3): 30 mg via INTRAVENOUS
  Filled 2015-05-02 (×3): qty 1

## 2015-05-02 MED ORDER — SODIUM CHLORIDE 0.9 % IR SOLN
Status: DC | PRN
  Administered 2015-05-02: 3000 mL

## 2015-05-02 MED ORDER — ONDANSETRON HCL 4 MG/2ML IJ SOLN
INTRAMUSCULAR | Status: AC
  Filled 2015-05-02: qty 2

## 2015-05-02 MED ORDER — FENTANYL CITRATE (PF) 250 MCG/5ML IJ SOLN
INTRAMUSCULAR | Status: AC
  Filled 2015-05-02: qty 5

## 2015-05-02 SURGICAL SUPPLY — 58 items
BARRIER ADHS 3X4 INTERCEED (GAUZE/BANDAGES/DRESSINGS) ×3 IMPLANT
BENZOIN TINCTURE PRP APPL 2/3 (GAUZE/BANDAGES/DRESSINGS) IMPLANT
CATH FOLEY 3WAY  5CC 16FR (CATHETERS) ×1
CATH FOLEY 3WAY 5CC 16FR (CATHETERS) ×2 IMPLANT
CONT PATH 16OZ SNAP LID 3702 (MISCELLANEOUS) ×3 IMPLANT
COVER BACK TABLE 60X90IN (DRAPES) ×6 IMPLANT
COVER TIP SHEARS 8 DVNC (MISCELLANEOUS) ×2 IMPLANT
COVER TIP SHEARS 8MM DA VINCI (MISCELLANEOUS) ×1
DECANTER SPIKE VIAL GLASS SM (MISCELLANEOUS) ×9 IMPLANT
DEFOGGER SCOPE WARMER CLEARIFY (MISCELLANEOUS) ×3 IMPLANT
DILATOR CANAL MILEX (MISCELLANEOUS) ×3 IMPLANT
DRSG COVADERM PLUS 2X2 (GAUZE/BANDAGES/DRESSINGS) ×12 IMPLANT
DRSG OPSITE POSTOP 3X4 (GAUZE/BANDAGES/DRESSINGS) ×3 IMPLANT
DURAPREP 26ML APPLICATOR (WOUND CARE) ×3 IMPLANT
ELECT REM PT RETURN 9FT ADLT (ELECTROSURGICAL) ×3
ELECTRODE REM PT RTRN 9FT ADLT (ELECTROSURGICAL) ×2 IMPLANT
GAUZE VASELINE 3X9 (GAUZE/BANDAGES/DRESSINGS) IMPLANT
GLOVE BIOGEL M 6.5 STRL (GLOVE) ×9 IMPLANT
GLOVE BIOGEL PI IND STRL 6.5 (GLOVE) ×2 IMPLANT
GLOVE BIOGEL PI IND STRL 7.0 (GLOVE) ×10 IMPLANT
GLOVE BIOGEL PI INDICATOR 6.5 (GLOVE) ×1
GLOVE BIOGEL PI INDICATOR 7.0 (GLOVE) ×5
KIT ACCESSORY DA VINCI DISP (KITS) ×1
KIT ACCESSORY DVNC DISP (KITS) ×2 IMPLANT
LEGGING LITHOTOMY PAIR STRL (DRAPES) ×3 IMPLANT
OCCLUDER COLPOPNEUMO (BALLOONS) ×3 IMPLANT
PACK ROBOT WH (CUSTOM PROCEDURE TRAY) ×3 IMPLANT
PACK ROBOTIC GOWN (GOWN DISPOSABLE) ×3 IMPLANT
PAD POSITIONING PINK XL (MISCELLANEOUS) ×3 IMPLANT
PAD PREP 24X48 CUFFED NSTRL (MISCELLANEOUS) ×6 IMPLANT
SET CYSTO W/LG BORE CLAMP LF (SET/KITS/TRAYS/PACK) ×3 IMPLANT
SET IRRIG TUBING LAPAROSCOPIC (IRRIGATION / IRRIGATOR) ×6 IMPLANT
SET TRI-LUMEN FLTR TB AIRSEAL (TUBING) ×3 IMPLANT
SOLUTION ELECTROLUBE (MISCELLANEOUS) ×3 IMPLANT
SUT VIC AB 0 CT1 27 (SUTURE) ×2
SUT VIC AB 0 CT1 27XBRD ANBCTR (SUTURE) ×4 IMPLANT
SUT VIC AB 0 CT1 27XBRD ANTBC (SUTURE) IMPLANT
SUT VICRYL 0 27 CT2 27 ABS (SUTURE) IMPLANT
SUT VICRYL 0 UR6 27IN ABS (SUTURE) ×3 IMPLANT
SUT VICRYL RAPIDE 4/0 PS 2 (SUTURE) ×12 IMPLANT
SUT VLOC 180 0 6IN GS21 (SUTURE) ×3 IMPLANT
SUT VLOC 180 0 9IN  GS21 (SUTURE) ×2
SUT VLOC 180 0 9IN GS21 (SUTURE) ×4 IMPLANT
SYR 50ML LL SCALE MARK (SYRINGE) ×6 IMPLANT
TIP RUMI ORANGE 6.7MMX12CM (TIP) IMPLANT
TIP UTERINE 5.1X6CM LAV DISP (MISCELLANEOUS) IMPLANT
TIP UTERINE 6.7X10CM GRN DISP (MISCELLANEOUS) IMPLANT
TIP UTERINE 6.7X6CM WHT DISP (MISCELLANEOUS) IMPLANT
TIP UTERINE 6.7X8CM BLUE DISP (MISCELLANEOUS) ×3 IMPLANT
TOWEL OR 17X24 6PK STRL BLUE (TOWEL DISPOSABLE) ×9 IMPLANT
TROCAR 12M 150ML BLUNT (TROCAR) ×3 IMPLANT
TROCAR DISP BLADELESS 8 DVNC (TROCAR) ×2 IMPLANT
TROCAR DISP BLADELESS 8MM (TROCAR) ×1
TROCAR PORT AIRSEAL 5X120 (TROCAR) IMPLANT
TROCAR PORT AIRSEAL 8X120 (TROCAR) ×3 IMPLANT
TROCAR XCEL 12X100 BLDLESS (ENDOMECHANICALS) IMPLANT
TROCAR XCEL NON-BLD 11X100MML (ENDOMECHANICALS) IMPLANT
WATER STERILE IRR 1000ML POUR (IV SOLUTION) ×3 IMPLANT

## 2015-05-02 NOTE — Op Note (Signed)
05/02/2015  4:52 PM  PATIENT:  Leah Mason  39 y.o. 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 Geryl Rankins was needed to assist due to the complexity of the surgery and the concern for adhesive disease.    ANESTHESIA:   general  EBL:  Total I/O In: 2200 [I.V.:2200] Out: 550 [Urine:450; Blood:100]  BLOOD ADMINISTERED:none  DRAINS: Urinary Catheter (Foley)   LOCAL MEDICATIONS USED:  OTHER ropivacaine   SPECIMEN:  Source of Specimen:  uterus cervix bilateral fallopian tubes and ovaries   DISPOSITION OF SPECIMEN:  PATHOLOGY  COUNTS:  YES  TOURNIQUET:  * No tourniquets in log *  DICTATION: .Dragon Dictation  PLAN OF CARE: Admit for overnight observation  PATIENT DISPOSITION:  PACU - hemodynamically stable.     Findings: normal size uterus.. Normal appearing fallopian tubes and ovaries.. Adhesions of the omentum and bowel to the anterior abdominal wall. Dense adhesions of the bladder to the anterior abdominal wall.    Delay start of Pharmacological VTE agent (>24 hrs) due to surgical blood loss or risk of bleeding: not applicable  Procedure: The patient was taken to the operating room where she was placed under general anesthesia.Time out was performed. Marland Kitchen She was placed in dorsal lithotomy position and prepped and draped in the usual sterile fashion. A weighted speculum was placed into the vagina. A Deaver was placed anteriorly for retraction. The anterior lip of the cervix was grasped with a single-tooth tenaculum.  The vaginal mucosa was injected with 2.5 cc of ropivacaine at the 2/4/ 8 and 10 o'clock positions. The uterus was sounded to 9 cm the cervix was dilated to 6 mm . 0 vicryl suture placed at the 12 and 6:00 positions Of the cervix to facilitate placement of a Ru mi uterine manipulator. The manipulator was placed without difficulty. Weighted speculum and Deaver were removed .  Foley catheter was placed.   Attention was turned to the patient's abdomen where a 12 mm trocar was placed 4 cm above the umbilicus. under direct visualization . The pneumoperitoneum was achieved with PCO2 gas. The laparoscope was removed. 60 cc of ropivacaine were injected into the abdominal cavity. The laparoscope was reinserted. An 8 mm trocar was placed in the right upper quadrant 16 centimeters from the umbilicus.later connected to robotic arm #3). An MM incision was made in the Right upper quadrant TROCAR WAS PLACED 8 cm from the umbilicus. Later connected to robotic arm #1. An 8 mm incision was made in the left upper quadrant 16 cm from the umbilicus and connected to robot arm #2. Marland Kitchen Attention was turned to the left upper quadrant where a 8  mm midclavicular assistant trocar was placed. ( All incision sites were injected with 10 cc of ropivacaine prior to port placement. )   Once all ports had been placed under direct visualization.The laparoscope was removed and the da Vinci robotic system was thin right-sided docked. The robotic arms were connected to the corresponding trocars as listed above. The laparoscope was then reinserted. The PK bipolar cautery was placed into port #2. The monopolar scissor placed  in the port #1. A pro grasp was placed in port #3. All instruments were directed into the pelvis under direct visualization.  Attention was turned to the surgeons console..  The adhesions of the omentum and bowel were dissected of the anterior abdominal wall using scissors. Pt was noted to have a dense are of adhesion to the  anterior abdominal wall. it was unclear if this could potentially contain bowel .Marland Kitchen. To avoid injury to the bowel this segment was not dissected off the anterior abdominal wall. (Lysis of adhesions took a total of 1 hour to remove adhesions from the anterior abdominal wall and bladder)   The left ureter was identified The left infundibulopelvic  ligament was cauterized with PK and excised with scissors. The broad ligament was cauterized with PK incised with scissors. The round ligament was cauterized with the PK incised with scissors. The anterior leaf of broad ligament was incised along the bladder reflection to the midline.  The right ureter was identified. The right infundibulopelvic ligament was cauterized with PK and excised with scissors. The right broad ligament was cauterized with PK excised scissors. The right round ligament was cauterized with PK and excised with scissors. The broad ligament was incised to the midline. The bladder was dissected off the lower uterine segments of the cervix via sharp and blunt dissection. The bladder was filled in a retrograde fashion with methylene blue and sterile water to help with dissection Of the bladder from the lower uterine segment.  The bladder was densely adhered to the lower uterine segment.   The uterine arteries were skeletonized bilaterally. They were cauterized with PK and transected. The Koh ring was identified. The anterior colpotomy was performed followed by the posterior colpotomy.  Once the uterus,cervix and bilateral fallopian tubes were completely excised the uterus was  removed through the vagina. The pK and scissors were removed and log tip forceps were placed in the port #1 and the cutting needle driver was placed in to port #2.  The vaginal cuff was closed with 0 v lock suture in a running fashion.  The pelvis was irrigated.  Marland Kitchen..Excellent hemostasis was noted. All pelvic pedicles were examined and hemostasis was noted.  Inter seed was placed  along the vaginal cuff. All instruments removed from the ports. All ports were removed under direct Visualization. The pneumoperitoneum was released. The fascia of the 12 mm umbilical port was closed with 0 Vicryl The skin incisions were closed with 4-0 Vicryl and then covered with Derma bond.   Sponge lap and needle counts were correct x. The patient was awakened from anesthesia and taken to the recovery room in stable condition.

## 2015-05-02 NOTE — Transfer of Care (Signed)
Immediate Anesthesia Transfer of Care Note  Patient: Kandis FantasiaSara Schreiner  Procedure(s) Performed: Procedure(s): ROBOTIC ASSISTED TOTAL HYSTERECTOMY  (N/A) SALPINGO OOPHORECTOMY (Bilateral) ROBOTIC ASSISTED LAPAROSCOPIC EXTENSIVE LYSIS OF ADHESIONS (N/A)  Patient Location: PACU  Anesthesia Type:General  Level of Consciousness: awake, alert  and oriented  Airway & Oxygen Therapy: Patient Spontanous Breathing and Patient connected to nasal cannula oxygen  Post-op Assessment: Report given to RN and Post -op Vital signs reviewed and stable  Post vital signs: Reviewed and stable  Last Vitals:  Filed Vitals:   05/02/15 0706  BP: 118/73  Pulse: 75  Temp: 36.6 C  Resp: 20    Complications: No apparent anesthesia complications

## 2015-05-02 NOTE — H&P (Signed)
Date of Initial H&P:05/01/2015 History reviewed, patient examined, no change in status, stable for surgery. 

## 2015-05-02 NOTE — Addendum Note (Signed)
Addendum  created 05/02/15 1824 by Graciela HusbandsWynn O Orlanda Lemmerman, CRNA   Modules edited: Notes Section   Notes Section:  File: [id number]

## 2015-05-02 NOTE — Addendum Note (Signed)
Addendum  created 05/02/15 1445 by Armanda Heritageharlesetta M Hadleigh Felber, CRNA   Modules edited: Anesthesia Medication Administration

## 2015-05-02 NOTE — Anesthesia Procedure Notes (Signed)
Procedure Name: Intubation Date/Time: 05/02/2015 8:37 AM Performed by: Raevon Broom, Jannet AskewHARLESETTA Mason Pre-anesthesia Checklist: Patient identified, Timeout performed, Emergency Drugs available, Suction available and Patient being monitored Patient Re-evaluated:Patient Re-evaluated prior to inductionOxygen Delivery Method: Circle system utilized Preoxygenation: Pre-oxygenation with 100% oxygen Intubation Type: IV induction Ventilation: Mask ventilation without difficulty Laryngoscope Size: Mac and 3 Grade View: Grade I Tube type: Oral Tube size: 7.0 mm Number of attempts: 1 Placement Confirmation: ETT inserted through vocal cords under direct vision,  breath sounds checked- equal and bilateral and positive ETCO2 Secured at: 21 cm Dental Injury: Teeth and Oropharynx as per pre-operative assessment

## 2015-05-02 NOTE — Anesthesia Postprocedure Evaluation (Signed)
Anesthesia Post Note  Patient: Leah Mason  Procedure(s) Performed: Procedure(s) (LRB): ROBOTIC ASSISTED TOTAL HYSTERECTOMY  (N/A) SALPINGO OOPHORECTOMY (Bilateral) ROBOTIC ASSISTED LAPAROSCOPIC EXTENSIVE LYSIS OF ADHESIONS (N/A)  Patient location during evaluation: PACU Anesthesia Type: General Level of consciousness: awake and alert Pain management: pain level controlled Vital Signs Assessment: post-procedure vital signs reviewed and stable Respiratory status: spontaneous breathing, nonlabored ventilation, respiratory function stable and patient connected to nasal cannula oxygen Cardiovascular status: blood pressure returned to baseline and stable Postop Assessment: no signs of nausea or vomiting Anesthetic complications: no    Last Vitals:  Filed Vitals:   05/02/15 1330 05/02/15 1345  BP: 131/83   Pulse: 80   Temp:    Resp: 15 16    Last Pain:  Filed Vitals:   05/02/15 1349  PainSc: 8                  Riordan Walle JENNETTE

## 2015-05-02 NOTE — Anesthesia Postprocedure Evaluation (Signed)
Anesthesia Post Note  Patient: Leah Mason  Procedure(s) Performed: Procedure(s) (LRB): ROBOTIC ASSISTED TOTAL HYSTERECTOMY  (N/A) SALPINGO OOPHORECTOMY (Bilateral) ROBOTIC ASSISTED LAPAROSCOPIC EXTENSIVE LYSIS OF ADHESIONS (N/A)  Patient location during evaluation: [facility] Anesthesia Type: General Level of consciousness: awake and alert Pain management: satisfactory to patient Vital Signs Assessment: post-procedure vital signs reviewed and stable Respiratory status: spontaneous breathing and respiratory function stable Cardiovascular status: stable Postop Assessment: adequate PO intake Anesthetic complications: no    Last Vitals:  Filed Vitals:   05/02/15 1500 05/02/15 1602  BP: 120/67 123/68  Pulse: 90 80  Temp: 36.8 C 37 C  Resp: 20 18    Last Pain:  Filed Vitals:   05/02/15 1755  PainSc: 4                  Keno Caraway

## 2015-05-03 ENCOUNTER — Encounter (HOSPITAL_COMMUNITY): Payer: Self-pay | Admitting: Obstetrics and Gynecology

## 2015-05-03 LAB — CBC
HEMATOCRIT: 36.6 % (ref 36.0–46.0)
HEMOGLOBIN: 12 g/dL (ref 12.0–15.0)
MCH: 29.9 pg (ref 26.0–34.0)
MCHC: 32.8 g/dL (ref 30.0–36.0)
MCV: 91 fL (ref 78.0–100.0)
Platelets: 158 10*3/uL (ref 150–400)
RBC: 4.02 MIL/uL (ref 3.87–5.11)
RDW: 13 % (ref 11.5–15.5)
WBC: 8.5 10*3/uL (ref 4.0–10.5)

## 2015-05-03 MED ORDER — OXYCODONE-ACETAMINOPHEN 5-325 MG PO TABS: 1.0000 | ORAL_TABLET | ORAL | Status: DC | PRN

## 2015-05-03 MED ORDER — IBUPROFEN 800 MG PO TABS: 800.0000 mg | ORAL_TABLET | Freq: Three times a day (TID) | ORAL | Status: DC | PRN

## 2015-05-03 MED ORDER — BISACODYL 10 MG RE SUPP
10.0000 mg | Freq: Every day | RECTAL | Status: DC | PRN
  Administered 2015-05-03: 10 mg via RECTAL
  Filled 2015-05-03: qty 1

## 2015-05-03 NOTE — Progress Notes (Signed)
Pt out in wheelchair  Teaching complete   

## 2015-06-06 NOTE — Discharge Summary (Signed)
Physician Discharge Summary  Patient ID: Leah Mason MRN: [id number] DOB/AGE: 02-08-1976 40 y.o.  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 She was admitted for 23 hr observation. She did well postoperatively with return of bowel and bladder function   Consults: None  Significant Diagnostic Studies: labs: hgb stable on POD #1 ( see results)   Treatments: surgery: Robotic assisted laparoscopic hysterectomy with bilateral salpingo oophorectomy and extensive lysis of adhesions  Discharge Exam: Blood pressure 118/56, pulse 86, temperature 98.1 F (36.7 C), temperature source Oral, resp. rate 18, height  (1.6 m), weight 194 lb (87.998 kg), SpO2 98 %. General appearance: alert and cooperative Resp: clear to auscultation bilaterally Cardio: regular rate and rhythm GI: soft appropriately tender +BS nondistended  Extremities: extremities normal, atraumatic, no cyanosis or edema Incision/Wound:clean dry and intact no erythema or exudated  Disposition: 01-Home or Self Care  Discharge Instructions    Call MD for:  persistant nausea and vomiting    Complete by:  As directed      Call MD for:  redness, tenderness, or signs of infection (pain, swelling, redness, odor or green/yellow discharge around incision site)    Complete by:  As directed      Call MD for:  severe uncontrolled pain    Complete by:  As directed      Call MD for:  temperature >100.4    Complete by:  As directed      Diet general    Complete by:  As directed      Driving Restrictions    Complete  by:  As directed   Avoid driving for 2 weeks     Increase activity slowly    Complete by:  As directed      Lifting restrictions    Complete by:  As directed   Avoid lifting over 10 lbs     May shower / Bathe    Complete by:  As directed      May walk up steps    Complete by:  As directed      Remove dressing in 24 hours    Complete by:  As directed      Sexual Activity Restrictions    Complete by:  As directed   Avoid sexual activity            Medication List    TAKE these medications        ibuprofen 800 MG tablet  Commonly known as:  ADVIL,MOTRIN  Take 1 tablet (800 mg total) by mouth every 8 (eight) hours as needed for mild pain (mild pain).     multivitamin with minerals Tabs tablet  Take 1 tablet by mouth daily.     oxyCODONE-acetaminophen 5-325 MG tablet  Commonly known as:  PERCOCET/ROXICET  Take 1-2 tablets by mouth every 4 (four) hours as needed for severe pain (moderate to severe pain (when tolerating fluids)).           Follow-up Information    Follow up with Jessee Avers., MD. Schedule an appointment as soon as possible for a visit in 2 weeks.   Specialty:  Obstetrics and  Gynecology   Why:  patient may already have an appointment.    Contact information:   [address] [place] [address] [phone]       Signed: Jessee Avers. 06/06/2015, 8:53 PM

## 2016-02-27 ENCOUNTER — Other Ambulatory Visit: Payer: Self-pay | Admitting: Obstetrics and Gynecology

## 2016-02-27 DIAGNOSIS — Z1231 Encounter for screening mammogram for malignant neoplasm of breast: Secondary | ICD-10-CM

## 2016-03-31 ENCOUNTER — Ambulatory Visit
Admission: RE | Admit: 2016-03-31 | Discharge: 2016-03-31 | Disposition: A | Discharge status: Home / Self Care | Payer: 59 | Source: Ambulatory Visit | Attending: Obstetrics and Gynecology | Admitting: Obstetrics and Gynecology

## 2016-03-31 ENCOUNTER — Ambulatory Visit: Payer: 59

## 2016-03-31 DIAGNOSIS — Z1231 Encounter for screening mammogram for malignant neoplasm of breast: Secondary | ICD-10-CM

## 2016-06-03 DIAGNOSIS — S83207A Unspecified tear of unspecified meniscus, current injury, left knee, initial encounter: Secondary | ICD-10-CM | POA: Diagnosis not present

## 2016-06-12 DIAGNOSIS — M25562 Pain in left knee: Secondary | ICD-10-CM | POA: Diagnosis not present

## 2016-06-18 DIAGNOSIS — M25562 Pain in left knee: Secondary | ICD-10-CM | POA: Diagnosis not present

## 2016-08-14 ENCOUNTER — Encounter: Payer: Self-pay | Admitting: Gastroenterology

## 2016-09-08 ENCOUNTER — Encounter: Payer: Self-pay | Admitting: Gastroenterology

## 2016-10-17 HISTORY — PX: COLONOSCOPY: SHX174

## 2016-10-24 ENCOUNTER — Ambulatory Visit (AMBULATORY_SURGERY_CENTER): Payer: Self-pay

## 2016-10-24 VITALS — Ht 63.0 in | Wt 205.0 lb

## 2016-10-24 DIAGNOSIS — Z1509 Genetic susceptibility to other malignant neoplasm: Secondary | ICD-10-CM

## 2016-10-24 MED ORDER — SUPREP BOWEL PREP KIT 17.5-3.13-1.6 GM/177ML PO SOLN: 1.0000 | Freq: Once | ORAL | 0 refills | Status: AC

## 2016-10-24 NOTE — Progress Notes (Signed)
No allergies to eggs or soy No diet meds No home oxygen No past problems with anesthesia  Registered emmi 

## 2016-11-07 ENCOUNTER — Encounter: Payer: Self-pay | Admitting: Gastroenterology

## 2016-11-07 ENCOUNTER — Ambulatory Visit (AMBULATORY_SURGERY_CENTER): Payer: 59 | Admitting: Gastroenterology

## 2016-11-07 VITALS — BP 110/72 | HR 63 | Temp 97.8°F | Resp 12 | Ht 63.0 in | Wt 205.0 lb

## 2016-11-07 DIAGNOSIS — Z1509 Genetic susceptibility to other malignant neoplasm: Secondary | ICD-10-CM

## 2016-11-07 DIAGNOSIS — Z1211 Encounter for screening for malignant neoplasm of colon: Secondary | ICD-10-CM | POA: Diagnosis not present

## 2016-11-07 DIAGNOSIS — Z1212 Encounter for screening for malignant neoplasm of rectum: Secondary | ICD-10-CM

## 2016-11-07 DIAGNOSIS — Z800 Family history of malignant neoplasm of digestive organs: Secondary | ICD-10-CM | POA: Diagnosis not present

## 2016-11-07 DIAGNOSIS — K633 Ulcer of intestine: Secondary | ICD-10-CM | POA: Diagnosis not present

## 2016-11-07 MED ORDER — SODIUM CHLORIDE 0.9 % IV SOLN
500.0000 mL | INTRAVENOUS | Status: DC
Start: 2016-11-07 — End: 2023-07-29

## 2016-11-07 NOTE — Op Note (Signed)
[facility] Patient Name: Leah Mason Procedure Date: 11/07/2016 7:55 AM MRN: [id number] Endoscopist: Mauri Pole , MD Age: 41 Referring MD:  Date of Birth: [birth year] Gender: Female Account #: [id number] Procedure:                Colonoscopy Indications:              Colon cancer screening in patient at increased                            risk: Family history of colorectal cancer in                            multiple 2nd degree relatives, Colon cancer                            screening in patient at increased risk: Family                            history of hereditary nonpolyposis colorectal                            cancer in 1st-degree relative, Personal history of                            Lynch Syndrome gene mutation (hereditary                            nonpolyposis colorectal cancer) Medicines:                Monitored Anesthesia Care Procedure:                Pre-Anesthesia Assessment:                           - Prior to the procedure, a History and Physical                            was performed, and patient medications and                            allergies were reviewed. The patient's tolerance of                            previous anesthesia was also reviewed. The risks                            and benefits of the procedure and the sedation                            options and risks were discussed with the patient.                            All questions were answered, and informed consent  was obtained. Prior Anticoagulants: The patient has                            taken no previous anticoagulant or antiplatelet                            agents. ASA Grade Assessment: II - A patient with                            mild systemic disease. After reviewing the risks                            and benefits, the patient was deemed in                            satisfactory condition to undergo the procedure.                       After obtaining informed consent, the colonoscope                            was passed under direct vision. Throughout the                            procedure, the patient's blood pressure, pulse, and                            oxygen saturations were monitored continuously. The                            Colonoscope was introduced through the anus and                            advanced to the the terminal ileum, with                            identification of the appendiceal orifice and IC                            valve. The colonoscopy was performed without                            difficulty. The patient tolerated the procedure                            well. The quality of the bowel preparation was                            excellent. The terminal ileum, ileocecal valve,                            appendiceal orifice, and rectum were photographed. Scope In: 8:07:57 AM Scope Out: 8:25:14 AM Scope Withdrawal Time: 0 hours 10 minutes 9 seconds  Total Procedure Duration: 0 hours 17 minutes  17 seconds  Findings:                 The perianal and digital rectal examinations were                            normal.                           A single localized non-bleeding erosion was found                            in the sigmoid colon. No stigmata of recent                            bleeding were seen. Biopsies were taken with a cold                            forceps for histology.                           Non-bleeding internal hemorrhoids were found during                            retroflexion. The hemorrhoids were small.                           The exam was otherwise without abnormality. Complications:            No immediate complications. Estimated Blood Loss:     Estimated blood loss was minimal. Impression:               - A single erosion in the sigmoid colon. Biopsied.                           - Non-bleeding internal hemorrhoids.                            - The examination was otherwise normal. Recommendation:           - Patient has a contact number available for                            emergencies. The signs and symptoms of potential                            delayed complications were discussed with the                            patient. Return to normal activities tomorrow.                            Written discharge instructions were provided to the                            patient.                           -  Resume previous diet.                           - Continue present medications.                           - Await pathology results.                           - Repeat colonoscopy in 2 years for screening                            purposes given history of lynch syndrome (PMS2 gene                            mutation)                           - Schedule EGD for screening as patient never had                            EGD Mauri Pole, MD 11/07/2016 8:35:04 AM This report has been signed electronically.

## 2016-11-07 NOTE — Progress Notes (Signed)
To recovery, report to Hodges, RN, VSS 

## 2016-11-07 NOTE — Patient Instructions (Signed)
YOU HAD AN ENDOSCOPIC PROCEDURE TODAY AT THE [facility]:   Refer to the procedure report that was given to you for any specific questions about what was found during the examination.  If the procedure report does not answer your questions, please call your gastroenterologist to clarify.  If you requested that your care partner not be given the details of your procedure findings, then the procedure report has been included in a sealed envelope for you to review at your convenience later.  YOU SHOULD EXPECT: Some feelings of bloating in the abdomen. Passage of more gas than usual.  Walking can help get rid of the air that was put into your GI tract during the procedure and reduce the bloating. If you had a lower endoscopy (such as a colonoscopy or flexible sigmoidoscopy) you may notice spotting of blood in your stool or on the toilet paper. If you underwent a bowel prep for your procedure, you may not have a normal bowel movement for a few days.  Please Note:  You might notice some irritation and congestion in your nose or some drainage.  This is from the oxygen used during your procedure.  There is no need for concern and it should clear up in a day or so.  SYMPTOMS TO REPORT IMMEDIATELY:   Following lower endoscopy (colonoscopy or flexible sigmoidoscopy):  Excessive amounts of blood in the stool  Significant tenderness or worsening of abdominal pains  Swelling of the abdomen that is new, acute  Fever of 100F or higher   For urgent or emergent issues, a gastroenterologist can be reached at any hour by calling [phone].   DIET:  We do recommend a small meal at first, but then you may proceed to your regular diet.  Drink plenty of fluids but you should avoid alcoholic beverages for 24 hours.  ACTIVITY:  You should plan to take it easy for the rest of today and you should NOT DRIVE or use heavy machinery until tomorrow (because of the sedation medicines used during the test).     FOLLOW UP: Our staff will call the number listed on your records the next business day following your procedure to check on you and address any questions or concerns that you may have regarding the information given to you following your procedure. If we do not reach you, we will leave a message.  However, if you are feeling well and you are not experiencing any problems, there is no need to return our call.  We will assume that you have returned to your regular daily activities without incident.  If any biopsies were taken you will be contacted by phone or by letter within the next 1-3 weeks.  Please call us at [phone] if you have not heard about the biopsies in 3 weeks.    SIGNATURES/CONFIDENTIALITY: You and/or your care partner have signed paperwork which will be entered into your electronic medical record.  These signatures attest to the fact that that the information above on your After Visit Summary has been reviewed and is understood.  Full responsibility of the confidentiality of this discharge information lies with you and/or your care-partner.  Your EGD has been scheduled, and we will see you again in 2 years fr another coolonoscopy.

## 2016-11-07 NOTE — Progress Notes (Signed)
Called to room to assist during endoscopic procedure.  Patient ID and intended procedure confirmed with present staff. Received instructions for my participation in the procedure from the performing physician.  

## 2016-11-07 NOTE — Progress Notes (Signed)
Pt's states no medical or surgical changes since previsit or office visit. maw 

## 2016-11-10 ENCOUNTER — Telehealth: Payer: Self-pay

## 2016-11-10 ENCOUNTER — Telehealth: Payer: Self-pay | Admitting: *Deleted

## 2016-11-10 NOTE — Telephone Encounter (Signed)
Attempted to reach pt. With follow up call following endoscopic procedure 11/07/16.   Pt.'s voice mailbox not set up, unable to LM on pt. Ans. Machine.

## 2016-11-10 NOTE — Telephone Encounter (Signed)
  Follow up Call-  Call back number 11/07/2016 10/04/2014  Post procedure Call Back phone  # #[phone] cell [phone]  Permission to leave phone message No Yes  Some recent data might be hidden     No answer at # given.  No voicemail set up to leave message.

## 2016-11-12 ENCOUNTER — Encounter: Payer: Self-pay | Admitting: Gastroenterology

## 2016-11-13 ENCOUNTER — Ambulatory Visit (AMBULATORY_SURGERY_CENTER): Payer: Self-pay

## 2016-11-13 ENCOUNTER — Encounter: Payer: Self-pay | Admitting: Gastroenterology

## 2016-11-13 VITALS — Ht 63.5 in | Wt 203.6 lb

## 2016-11-13 DIAGNOSIS — Z1509 Genetic susceptibility to other malignant neoplasm: Secondary | ICD-10-CM

## 2016-11-13 NOTE — Progress Notes (Signed)
No home oxygne No diet meds No allergies to eggs or soy No past problems with anesthesia  Declined emmi

## 2016-12-01 ENCOUNTER — Ambulatory Visit (AMBULATORY_SURGERY_CENTER): Payer: 59 | Admitting: Gastroenterology

## 2016-12-01 ENCOUNTER — Encounter: Payer: Self-pay | Admitting: Gastroenterology

## 2016-12-01 VITALS — BP 108/68 | HR 65 | Temp 99.1°F | Resp 11 | Ht 63.5 in | Wt 203.0 lb

## 2016-12-01 DIAGNOSIS — K2970 Gastritis, unspecified, without bleeding: Secondary | ICD-10-CM | POA: Diagnosis not present

## 2016-12-01 DIAGNOSIS — K2990 Gastroduodenitis, unspecified, without bleeding: Secondary | ICD-10-CM

## 2016-12-01 DIAGNOSIS — Z1509 Genetic susceptibility to other malignant neoplasm: Secondary | ICD-10-CM | POA: Diagnosis present

## 2016-12-01 DIAGNOSIS — K317 Polyp of stomach and duodenum: Secondary | ICD-10-CM | POA: Diagnosis not present

## 2016-12-01 DIAGNOSIS — K3189 Other diseases of stomach and duodenum: Secondary | ICD-10-CM | POA: Diagnosis not present

## 2016-12-01 MED ORDER — SODIUM CHLORIDE 0.9 % IV SOLN: 500.0000 mL | INTRAVENOUS | Status: AC

## 2016-12-01 NOTE — Progress Notes (Signed)
Called to room to assist during endoscopic procedure.  Patient ID and intended procedure confirmed with present staff. Received instructions for my participation in the procedure from the performing physician.  

## 2016-12-01 NOTE — Progress Notes (Signed)
Dental advisory given to patientAlert and oriented x3, pleased with MAC, report to RN Suz 

## 2016-12-01 NOTE — Patient Instructions (Signed)
YOU HAD AN ENDOSCOPIC PROCEDURE TODAY AT THE [facility]:   Refer to the procedure report that was given to you for any specific questions about what was found during the examination.  If the procedure report does not answer your questions, please call your gastroenterologist to clarify.  If you requested that your care partner not be given the details of your procedure findings, then the procedure report has been included in a sealed envelope for you to review at your convenience later.  YOU SHOULD EXPECT: Some feelings of bloating in the abdomen. Passage of more gas than usual.  Walking can help get rid of the air that was put into your GI tract during the procedure and reduce the bloating.   Please Note:  You might notice some irritation and congestion in your nose or some drainage.  This is from the oxygen used during your procedure.  There is no need for concern and it should clear up in a day or so.  SYMPTOMS TO REPORT IMMEDIATELY:    Following upper endoscopy (EGD)  Vomiting of blood or coffee ground material  New chest pain or pain under the shoulder blades  Painful or persistently difficult swallowing  New shortness of breath  Fever of 100F or higher  Black, tarry-looking stools  For urgent or emergent issues, a gastroenterologist can be reached at any hour by calling [phone].   DIET:  We do recommend a small meal at first, but then you may proceed to your regular diet.  Drink plenty of fluids but you should avoid alcoholic beverages for 24 hours.  ACTIVITY:  You should plan to take it easy for the rest of today and you should NOT DRIVE or use heavy machinery until tomorrow (because of the sedation medicines used during the test).    FOLLOW UP: Our staff will call the number listed on your records the next business day following your procedure to check on you and address any questions or concerns that you may have regarding the information given to you  following your procedure. If we do not reach you, we will leave a message.  However, if you are feeling well and you are not experiencing any problems, there is no need to return our call.  We will assume that you have returned to your regular daily activities without incident.  If any biopsies were taken you will be contacted by phone or by letter within the next 1-3 weeks.  Please call us at [phone] if you have not heard about the biopsies in 3 weeks.    SIGNATURES/CONFIDENTIALITY: You and/or your care partner have signed paperwork which will be entered into your electronic medical record.  These signatures attest to the fact that that the information above on your After Visit Summary has been reviewed and is understood.  Full responsibility of the confidentiality of this discharge information lies with you and/or your care-partner.  No NSAIDS : Aleve, Aspirin, nor ibuprofen for two weeks per Dr. Lavon PaganiniNandigam.

## 2016-12-01 NOTE — Op Note (Signed)
[facility] Patient Name: Leah FantasiaSara Mason Procedure Date: 12/01/2016 9:48 AM MRN: [id number] Endoscopist: Napoleon FormKavitha V. Martika Egler , MD Age: 41 Referring MD:  Date of Birth: 1976/03/14 Gender: Female Account #: [id number] Procedure:                Upper GI endoscopy Indications:              Surveillance for malignancy due to personal history                            of polyposis syndrome Medicines:                Monitored Anesthesia Care Procedure:                Pre-Anesthesia Assessment:                           - Prior to the procedure, a History and Physical                            was performed, and patient medications and                            allergies were reviewed. The patient's tolerance of                            previous anesthesia was also reviewed. The risks                            and benefits of the procedure and the sedation                            options and risks were discussed with the patient.                            All questions were answered, and informed consent                            was obtained. Prior Anticoagulants: The patient has                            taken no previous anticoagulant or antiplatelet                            agents. ASA Grade Assessment: II - A patient with                            mild systemic disease. After reviewing the risks                            and benefits, the patient was deemed in                            satisfactory condition to undergo the procedure.  After obtaining informed consent, the endoscope was                            passed under direct vision. Throughout the                            procedure, the patient's blood pressure, pulse, and                            oxygen saturations were monitored continuously. The                            Model [id number]-[id number]) scope was introduced                            through the mouth, and  advanced to the third part                            of duodenum. The upper GI endoscopy was                            accomplished without difficulty. The patient                            tolerated the procedure well. Findings:                 The esophagus was normal.                           Patchy mildly erythematous mucosa without bleeding                            was found in the gastric antrum. Biopsies were                            taken with a cold forceps for Helicobacter pylori                            testing.                           Three 5 to 12 mm semi-sessile polyps with no                            bleeding and stigmata of recent bleeding,                            superficial erosions on largest polyp were found in                            the gastric body. The polyps were removed with a                            cold snare. Resection and retrieval were complete.  The examined duodenum was normal. Complications:            No immediate complications. Estimated Blood Loss:     Estimated blood loss was minimal. Impression:               - Normal esophagus.                           - Erythematous mucosa in the antrum. Biopsied.                           - Three gastric polyps. Resected and retrieved.                           - Normal examined duodenum. Recommendation:           - Patient has a contact number available for                            emergencies. The signs and symptoms of potential                            delayed complications were discussed with the                            patient. Return to normal activities tomorrow.                            Written discharge instructions were provided to the                            patient.                           - Resume previous diet.                           - Continue present medications.                           - Await pathology results.                            - No aspirin, ibuprofen, naproxen, or other                            non-steroidal anti-inflammatory drugs for 2 weeks.                           - Repeat upper endoscopy after studies are complete                            for surveillance based on pathology results. Napoleon Form, MD 12/01/2016 10:10:59 AM This report has been signed electronically.

## 2016-12-02 ENCOUNTER — Telehealth: Payer: Self-pay | Admitting: *Deleted

## 2016-12-02 ENCOUNTER — Telehealth: Payer: Self-pay

## 2016-12-02 NOTE — Telephone Encounter (Signed)
  Follow up Call-  Call back number 12/01/2016 11/07/2016 10/04/2014  Post procedure Call Back phone  # [phone] #[phone] cell [phone]  Permission to leave phone message No No Yes  comments non voice mail set up - -  Some recent data might be hidden     Patient questions:  VM box not set up.  Second call.

## 2016-12-02 NOTE — Telephone Encounter (Signed)
No name or number identifier, left a message. 

## 2016-12-05 ENCOUNTER — Encounter: Payer: Self-pay | Admitting: Gastroenterology

## 2017-03-24 ENCOUNTER — Other Ambulatory Visit: Payer: Self-pay | Admitting: Obstetrics and Gynecology

## 2017-03-24 DIAGNOSIS — Z1231 Encounter for screening mammogram for malignant neoplasm of breast: Secondary | ICD-10-CM

## 2017-04-02 DIAGNOSIS — M7712 Lateral epicondylitis, left elbow: Secondary | ICD-10-CM | POA: Diagnosis not present

## 2017-04-21 ENCOUNTER — Ambulatory Visit
Admission: RE | Admit: 2017-04-21 | Discharge: 2017-04-21 | Disposition: A | Discharge status: Home / Self Care | Payer: 59 | Source: Ambulatory Visit | Attending: Obstetrics and Gynecology | Admitting: Obstetrics and Gynecology

## 2017-04-21 DIAGNOSIS — Z1231 Encounter for screening mammogram for malignant neoplasm of breast: Secondary | ICD-10-CM

## 2017-04-22 ENCOUNTER — Other Ambulatory Visit: Payer: Self-pay | Admitting: Obstetrics and Gynecology

## 2017-04-22 DIAGNOSIS — R928 Other abnormal and inconclusive findings on diagnostic imaging of breast: Secondary | ICD-10-CM

## 2017-04-28 ENCOUNTER — Ambulatory Visit
Admission: RE | Admit: 2017-04-28 | Discharge: 2017-04-28 | Disposition: A | Discharge status: Home / Self Care | Payer: 59 | Source: Ambulatory Visit | Attending: Obstetrics and Gynecology | Admitting: Obstetrics and Gynecology

## 2017-04-28 ENCOUNTER — Ambulatory Visit: Payer: 59

## 2017-04-28 ENCOUNTER — Other Ambulatory Visit: Payer: 59

## 2017-04-28 DIAGNOSIS — R928 Other abnormal and inconclusive findings on diagnostic imaging of breast: Secondary | ICD-10-CM

## 2017-05-07 DIAGNOSIS — Z0000 Encounter for general adult medical examination without abnormal findings: Secondary | ICD-10-CM | POA: Diagnosis not present

## 2017-05-07 DIAGNOSIS — Z13220 Encounter for screening for lipoid disorders: Secondary | ICD-10-CM | POA: Diagnosis not present

## 2017-11-05 ENCOUNTER — Encounter: Payer: Self-pay | Admitting: Gastroenterology

## 2017-11-17 ENCOUNTER — Emergency Department (HOSPITAL_BASED_OUTPATIENT_CLINIC_OR_DEPARTMENT_OTHER)
Admission: EM | Admit: 2017-11-17 | Discharge: 2017-11-17 | Disposition: A | Discharge status: Home / Self Care | Payer: 59 | Attending: Emergency Medicine | Admitting: Emergency Medicine

## 2017-11-17 ENCOUNTER — Encounter (HOSPITAL_BASED_OUTPATIENT_CLINIC_OR_DEPARTMENT_OTHER): Payer: Self-pay | Admitting: Emergency Medicine

## 2017-11-17 ENCOUNTER — Emergency Department (HOSPITAL_BASED_OUTPATIENT_CLINIC_OR_DEPARTMENT_OTHER): Payer: 59

## 2017-11-17 ENCOUNTER — Other Ambulatory Visit: Payer: Self-pay

## 2017-11-17 DIAGNOSIS — Z79899 Other long term (current) drug therapy: Secondary | ICD-10-CM | POA: Diagnosis not present

## 2017-11-17 DIAGNOSIS — R0789 Other chest pain: Secondary | ICD-10-CM | POA: Insufficient documentation

## 2017-11-17 DIAGNOSIS — R0602 Shortness of breath: Secondary | ICD-10-CM | POA: Insufficient documentation

## 2017-11-17 DIAGNOSIS — M62830 Muscle spasm of back: Secondary | ICD-10-CM | POA: Diagnosis not present

## 2017-11-17 DIAGNOSIS — M25512 Pain in left shoulder: Secondary | ICD-10-CM | POA: Diagnosis not present

## 2017-11-17 DIAGNOSIS — M549 Dorsalgia, unspecified: Secondary | ICD-10-CM | POA: Diagnosis present

## 2017-11-17 LAB — CBC WITH DIFFERENTIAL/PLATELET
BASOS PCT: 1 %
Basophils Absolute: 0 10*3/uL (ref 0.0–0.1)
Eosinophils Absolute: 0.1 10*3/uL (ref 0.0–0.7)
Eosinophils Relative: 3 %
HEMATOCRIT: 44.1 % (ref 36.0–46.0)
Hemoglobin: 14.9 g/dL (ref 12.0–15.0)
LYMPHS PCT: 48 %
Lymphs Abs: 2.4 10*3/uL (ref 0.7–4.0)
MCH: 30.2 pg (ref 26.0–34.0)
MCHC: 33.8 g/dL (ref 30.0–36.0)
MCV: 89.5 fL (ref 78.0–100.0)
MONO ABS: 0.5 10*3/uL (ref 0.1–1.0)
MONOS PCT: 10 %
NEUTROS ABS: 1.9 10*3/uL (ref 1.7–7.7)
Neutrophils Relative %: 38 %
Platelets: 187 10*3/uL (ref 150–400)
RBC: 4.93 MIL/uL (ref 3.87–5.11)
RDW: 12.7 % (ref 11.5–15.5)
WBC: 4.9 10*3/uL (ref 4.0–10.5)

## 2017-11-17 LAB — COMPREHENSIVE METABOLIC PANEL
ALT: 20 U/L (ref 0–44)
ANION GAP: 10 (ref 5–15)
AST: 22 U/L (ref 15–41)
Albumin: 4.6 g/dL (ref 3.5–5.0)
Alkaline Phosphatase: 53 U/L (ref 38–126)
BILIRUBIN TOTAL: 0.4 mg/dL (ref 0.3–1.2)
BUN: 17 mg/dL (ref 6–20)
CO2: 27 mmol/L (ref 22–32)
Calcium: 9.6 mg/dL (ref 8.9–10.3)
Chloride: 106 mmol/L (ref 98–111)
Creatinine, Ser: 0.76 mg/dL (ref 0.44–1.00)
GFR calc Af Amer: >60 mL/min (ref >60)
Glucose, Bld: 108 mg/dL — ABNORMAL HIGH (ref 70–99)
POTASSIUM: 3.7 mmol/L (ref 3.5–5.1)
Sodium: 143 mmol/L (ref 135–145)
TOTAL PROTEIN: 7.6 g/dL (ref 6.5–8.1)

## 2017-11-17 LAB — TROPONIN I

## 2017-11-17 LAB — D-DIMER, QUANTITATIVE (NOT AT ARMC): D DIMER QUANT: 0.33 ug{FEU}/mL (ref 0.00–0.50)

## 2017-11-17 LAB — LIPASE, BLOOD: LIPASE: 35 U/L (ref 11–51)

## 2017-11-17 MED ORDER — KETOROLAC TROMETHAMINE 30 MG/ML IJ SOLN
15.0000 mg | Freq: Once | INTRAMUSCULAR | Status: AC
  Administered 2017-11-17: 15 mg via INTRAVENOUS
  Filled 2017-11-17: qty 1

## 2017-11-17 NOTE — ED Notes (Signed)
ED Provider at bedside. 

## 2017-11-17 NOTE — ED Triage Notes (Signed)
Pt states this am at 0830 she started having left scapular pain.  Pain is severe, pt feels sob, cannot take a deep breath.  Pt is hyperventilating and crying.  Encouraged to slow down breathing.

## 2017-11-17 NOTE — ED Provider Notes (Signed)
[facility] Provider Note   CSN: [id number] Arrival date & time: 11/17/17  0856     History   Chief Complaint Chief Complaint  Patient presents with  . Back Pain  . Shortness of Breath    HPI Leah Mason is a 42 y.o. female.  The history is provided by the patient. No language interpreter was used.  Back Pain    Shortness of Breath    Leah Mason is a 42 y.o. female who presents to the Emergency Department complaining of back pain, sob. At 830 this morning she developed severe left scapular pain described as a pressure type sensation with shortness of breath. Pain limits her ability to take a deep breath. She denies any fevers, nausea, vomiting, abdominal pain, leg swelling or pain. No prior similar symptoms. She was feeling well prior to this episode. She has a history of Lynch's syndrome, status post total hysterectomy two years ago. She takes no hormone medications. She is a non-smoker. She has no history of cardiac disease. No family history of cardiac disease or blood clots. Symptoms are severe and constant nature. Past Medical History:  Diagnosis Date  . Family history of breast cancer   . Family history of Lynch syndrome   . Family history of uterine cancer   . Lynch syndrome   . PMS2-related Lynch syndrome (HNPCC4)   . Seizures ([place])    last seizure was "about a year ago, not sure of when exactly per pt". maw    Patient Active Problem List   Diagnosis Date Noted  . S/P laparoscopic hysterectomy 05/02/2015  . PMS2-related Lynch syndrome (HNPCC4)   . Genetic testing 08/15/2014  . Lynch syndrome 08/15/2014  . Family history of uterine cancer   . Family history of breast cancer   . Family history of Lynch syndrome   . Aphasia 12/22/2013  . Dysarthria 12/22/2013  . Simple partial seizures ([place]) 12/22/2013    Past Surgical History:  Procedure Laterality Date  . ABDOMINAL HYSTERECTOMY    . CESAREAN SECTION     x 3  . COLONOSCOPY      . ROBOTIC ASSISTED LAPAROSCOPIC LYSIS OF ADHESION N/A 05/02/2015   Procedure: ROBOTIC ASSISTED LAPAROSCOPIC EXTENSIVE LYSIS OF ADHESIONS;  Surgeon: Christophe Louis, MD;  Location: [facility];  Service: Gynecology;  Laterality: N/A;  . ROBOTIC ASSISTED TOTAL HYSTERECTOMY WITH BILATERAL SALPINGO OOPHERECTOMY N/A 05/02/2015   Procedure: ROBOTIC ASSISTED TOTAL HYSTERECTOMY ;  Surgeon: Christophe Louis, MD;  Location: [facility];  Service: Gynecology;  Laterality: N/A;  . SALPINGOOPHORECTOMY Bilateral 05/02/2015   Procedure: SALPINGO OOPHORECTOMY;  Surgeon: Christophe Louis, MD;  Location: [facility];  Service: Gynecology;  Laterality: Bilateral;  . TUBAL LIGATION    . WISDOM TOOTH EXTRACTION       OB History   None      Home Medications    Prior to Admission medications   Medication Sig Start Date End Date Taking? Authorizing Provider  calcium carbonate (OSCAL) 1500 (600 Ca) MG TABS tablet Take by mouth daily with breakfast.    [provider]  cholecalciferol (VITAMIN D) 1000 units tablet Take 1,000 Units by mouth daily.    [provider]  magnesium oxide (MAG-OX) 400 MG tablet Take 400 mg by mouth daily.    [provider]  Multiple Vitamin (MULTIVITAMIN WITH MINERALS) TABS tablet Take 1 tablet by mouth daily.    [provider]    Family History Family History  Problem Relation Age of Onset  .  Breast cancer Mother 87  . Uterine cancer Mother 40  . COPD Maternal Aunt   . Prostate cancer Maternal Grandfather        dx in his 31s  . Cancer Cousin 6       wilms tumor  . Colon cancer Cousin        mother's maternal cousin  . Colon cancer Other        mother's maternal great aunt  . Pancreatic cancer Cousin        mother's paternal first cousin  . Esophageal cancer Neg Hx   . Rectal cancer Neg Hx   . Stomach cancer Neg Hx     Social History Social History   Tobacco Use  . Smoking status: Never Smoker  . Smokeless tobacco: Never Used  Substance Use Topics  .  Alcohol use: No    Alcohol/week: 0.0 oz  . Drug use: No     Allergies   Patient has no known allergies.   Review of Systems Review of Systems  Respiratory: Positive for shortness of breath.   Musculoskeletal: Positive for back pain.  All other systems reviewed and are negative.    Physical Exam Updated Vital Signs BP 102/68   Pulse 74   Resp 19   Ht _0  (1.6 m)   Wt 88.5 kg (195 lb)   LMP 04/23/2015 (Exact Date) Comment: hysterectomy wiht ovaries removed  SpO2 95%   BMI 34.54 kg/m   Physical Exam  Constitutional: She is oriented to person, place, and time. She appears well-developed and well-nourished.  HENT:  Head: Normocephalic and atraumatic.  Cardiovascular: Normal rate and regular rhythm.  No murmur heard. Pulmonary/Chest: Effort normal and breath sounds normal. No respiratory distress. She exhibits no tenderness.  Abdominal: Soft. There is no tenderness. There is no rebound and no guarding.  Musculoskeletal: She exhibits no edema or tenderness.  2+ radial pulses bilaterally. 2+ DP pulses bilaterally.  Neurological: She is alert and oriented to person, place, and time.  Skin: Skin is warm and dry.  Psychiatric: She has a normal mood and affect. Her behavior is normal.  Nursing note and vitals reviewed.    ED Treatments / Results  Labs (all labs ordered are listed, but only abnormal results are displayed) Labs Reviewed  COMPREHENSIVE METABOLIC PANEL - Abnormal; Notable for the following components:      Result Value   Glucose, Bld 108 (*)    All other components within normal limits  CBC WITH DIFFERENTIAL/PLATELET  LIPASE, BLOOD  TROPONIN I  D-DIMER, QUANTITATIVE (NOT AT [facility])  TROPONIN I    EKG EKG Interpretation  Date/Time:  Tuesday November 17 2017 09:10:40 EDT Ventricular Rate:  80 PR Interval:    QRS Duration: 88 QT Interval:  369 QTC Calculation: 426 R Axis:   67 Text Interpretation:  Sinus rhythm Low voltage, precordial leads Confirmed  by Quintella Reichert [phone]) on 11/17/2017 9:15:26 AM   Radiology Dg Chest 2 View  Result Date: 11/17/2017 CLINICAL DATA:  Chest and back pressure sensation resulting and shortness of breath since 08/30 this morning. EXAM: CHEST - 2 VIEW COMPARISON:  None in PACs FINDINGS: The lungs are adequately inflated and clear. The heart and pulmonary vascularity are normal. The mediastinum is normal in width. There is no pleural effusion. There is mild degenerative disc disease of the thoracic spine. IMPRESSION: There is no active cardiopulmonary disease. Electronically Signed   By: David  Martinique M.D.   On: 11/17/2017 09:52  Procedures Procedures (including critical care time)  Medications Ordered in ED Medications  ketorolac (TORADOL) 30 MG/ML injection 15 mg (15 mg Intravenous Given 11/17/17 1011)     Initial Impression / Assessment and Plan / ED Course  I have reviewed the triage vital signs and the nursing notes.  Pertinent labs & imaging results that were available during my care of the patient were reviewed by me and considered in my medical decision making (see chart for details).     Patient here for evaluation of left upper back pain with shortness of breath. Her pain completely resolved after one-time dose of Toradol. EKG without acute ischemic changes. Current presentation is not consistent with ACS, dissection. Doubt PE, she is low risk with negative D dimer. Discussed with patient home care for chest pain, likely muscle spasm. Discussed outpatient follow-up and return precautions.  Final Clinical Impressions(s) / ED Diagnoses   Final diagnoses:  Atypical chest pain  Back muscle spasm    ED Discharge Orders    None       Quintella Reichert, MD 11/17/17 1225

## 2018-03-30 ENCOUNTER — Other Ambulatory Visit: Payer: Self-pay | Admitting: Obstetrics and Gynecology

## 2018-03-30 DIAGNOSIS — Z1231 Encounter for screening mammogram for malignant neoplasm of breast: Secondary | ICD-10-CM

## 2018-05-13 ENCOUNTER — Ambulatory Visit: Payer: 59

## 2018-06-21 ENCOUNTER — Ambulatory Visit
Admission: RE | Admit: 2018-06-21 | Discharge: 2018-06-21 | Disposition: A | Discharge status: Home / Self Care | Payer: 59 | Source: Ambulatory Visit | Attending: Obstetrics and Gynecology | Admitting: Obstetrics and Gynecology

## 2018-06-21 DIAGNOSIS — Z1231 Encounter for screening mammogram for malignant neoplasm of breast: Secondary | ICD-10-CM | POA: Diagnosis not present

## 2019-07-19 ENCOUNTER — Encounter (INDEPENDENT_AMBULATORY_CARE_PROVIDER_SITE_OTHER): Payer: Self-pay | Admitting: Family Medicine

## 2019-07-19 ENCOUNTER — Ambulatory Visit (INDEPENDENT_AMBULATORY_CARE_PROVIDER_SITE_OTHER): Payer: 59 | Admitting: Family Medicine

## 2019-07-19 ENCOUNTER — Other Ambulatory Visit: Payer: Self-pay

## 2019-07-19 VITALS — BP 110/73 | HR 68 | Temp 97.9°F | Ht 64.0 in | Wt 202.0 lb

## 2019-07-19 DIAGNOSIS — R0602 Shortness of breath: Secondary | ICD-10-CM | POA: Diagnosis not present

## 2019-07-19 DIAGNOSIS — R5383 Other fatigue: Secondary | ICD-10-CM | POA: Diagnosis not present

## 2019-07-19 DIAGNOSIS — Z1331 Encounter for screening for depression: Secondary | ICD-10-CM

## 2019-07-19 DIAGNOSIS — Z9189 Other specified personal risk factors, not elsewhere classified: Secondary | ICD-10-CM

## 2019-07-19 DIAGNOSIS — Z6834 Body mass index (BMI) 34.0-34.9, adult: Secondary | ICD-10-CM

## 2019-07-19 DIAGNOSIS — R739 Hyperglycemia, unspecified: Secondary | ICD-10-CM

## 2019-07-19 DIAGNOSIS — Z1509 Genetic susceptibility to other malignant neoplasm: Secondary | ICD-10-CM | POA: Diagnosis not present

## 2019-07-19 DIAGNOSIS — Z0289 Encounter for other administrative examinations: Secondary | ICD-10-CM

## 2019-07-19 DIAGNOSIS — E669 Obesity, unspecified: Secondary | ICD-10-CM

## 2019-07-19 DIAGNOSIS — E66811 Obesity, class 1: Secondary | ICD-10-CM

## 2019-07-19 NOTE — Progress Notes (Signed)
Chief Complaint:   OBESITY Leah Mason (MR# [id number]) is a 44 y.o. female who presents for evaluation and treatment of obesity and related comorbidities. Current BMI is Body mass index is 34.67 kg/m. Leah Mason has been struggling with her weight for many years and has been unsuccessful in either losing weight, maintaining weight loss, or reaching her healthy weight goal.  Leah Mason is currently in the action stage of change and ready to dedicate time achieving and maintaining a healthier weight. Leah Mason is interested in becoming our patient and working on intensive lifestyle modifications including (but not limited to) diet and exercise for weight loss.  Leah Mason's habits were reviewed today and are as follows: Her family eats meals together, she thinks her family will eat healthier with her, her desired weight loss is 32 lbs, she started gaining weight after surgery, her heaviest weight ever was 204 pounds and she skips meals frequently.  Depression Screen Leah Mason's Food and Mood (modified PHQ-9) score was 2.  Depression screen Leah Mason 2/9 07/19/2019  Decreased Interest 0  Down, Depressed, Hopeless 0  PHQ - 2 Score 0  Altered sleeping 0  Tired, decreased energy 0  Change in appetite 0  Feeling bad or failure about yourself  1  Trouble concentrating 1  Moving slowly or fidgety/restless 0  Suicidal thoughts 0  PHQ-9 Score 2  Difficult doing work/chores Not difficult at all   Subjective:   1. Other fatigue Leah Mason admits to daytime somnolence and denies waking up still tired. Patent has a history of symptoms of daytime fatigue. Leah Mason generally gets 6 or 7 hours of sleep per night, and states that she has generally restful sleep. Snoring is not present. Apneic episodes are not present. Epworth Sleepiness Score is 5.  2. Shortness of breath on exertion Leah Mason notes increasing shortness of breath with exercising and seems to be worsening over time with weight gain. She notes getting out of breath  sooner with activity than she used to. This has not gotten worse recently. Leah Mason denies shortness of breath at rest or orthopnea.  3. Lynch syndrome Leah Mason is status post total hysterectomy. She is still at risk of other cancers due to obesity.  4. Hyperglycemia Leah Mason has a history of elevated fasting glucose, and she denies polyphagia.  5. At risk for diabetes mellitus Leah Mason is at higher than average risk for developing diabetes due to her obesity.   Assessment/Plan:   1. Other fatigue Leah Mason does not feel that her weight is causing her energy to be lower than it should be. Fatigue may be related to obesity, depression or many other causes. Labs will be ordered, and in the meanwhile, Leah Mason will focus on self care including making healthy food choices, increasing physical activity and focusing on stress reduction.  - EKG 12-Lead  2. Shortness of breath on exertion Leah Mason does not feel that she gets out of breath more easily that she used to when she exercises. Leah Mason's shortness of breath appears to be obesity related and exercise induced. She has agreed to work on weight loss and gradually increase exercise to treat her exercise induced shortness of breath. Will continue to monitor closely.  3. Lynch syndrome We will continue to monitor and Leah Mason will work on diet and weight loss to decrease cancer risk.  4. Hyperglycemia Fasting labs will be obtained today and results with be discussed with Leah Mason in 2 weeks at her follow up visit. In the meanwhile Leah Mason was started on a lower  simple carbohydrate diet and will work on weight loss efforts.  5. Depression screening Leah Mason had a negative depression screening. Depression is commonly associated with obesity and often results in emotional eating behaviors. We will monitor this closely and work on CBT to help improve the non-hunger eating patterns. Referral to Psychology may be required if no improvement is seen as she continues in our clinic.  6. At risk for  diabetes mellitus Leah Mason was given approximately 15 minutes of diabetes education and counseling today. We discussed intensive lifestyle modifications today with an emphasis on weight loss as well as increasing exercise and decreasing simple carbohydrates in her diet. We also reviewed medication options with an emphasis on risk versus benefit of those discussed.   Repetitive spaced learning was employed today to elicit superior memory formation and behavioral change.  7. Class 1 obesity with serious comorbidity and body mass index (BMI) of 34.0 to 34.9 in adult, unspecified obesity type Leah Mason is currently in the action stage of change and her goal is to continue with weight loss efforts. I recommend Leah Mason begin the structured treatment plan as follows:  She has agreed to the Category 2 Plan.  Exercise goals: No exercise has been prescribed for now, while we concentrate on nutritional changes.   Behavioral modification strategies: increasing lean protein intake and no skipping meals.  She was informed of the importance of frequent follow-up visits to maximize her success with intensive lifestyle modifications for her multiple health conditions. She was informed we would discuss her lab results at her next visit unless there is a critical issue that needs to be addressed sooner. Leah Mason agreed to keep her next visit at the agreed upon time to discuss these results.  Objective:   Blood pressure 110/73, pulse 68, temperature 97.9 F (36.6 C), temperature source Oral, height 5\' 4"  (1.626 m), weight 202 lb (91.6 kg), last menstrual period 04/23/2015, SpO2 97 %. Body mass index is 34.67 kg/m.  EKG: Normal sinus rhythm, rate 69 BPM.  Indirect Calorimeter completed today shows a VO2 of 245 and a REE of 1703.  Her calculated basal metabolic rate is 14/09/2014 thus her basal metabolic rate is better than expected.  General: Cooperative, alert, well developed, in no acute distress. HEENT: Conjunctivae and lids  unremarkable. Cardiovascular: Regular rhythm.  Lungs: Normal work of breathing. Neurologic: No focal deficits.   Lab Results  Component Value Date   CREATININE 0.76 11/17/2017   BUN 17 11/17/2017   NA 143 11/17/2017   K 3.7 11/17/2017   CL 106 11/17/2017   CO2 27 11/17/2017   Lab Results  Component Value Date   ALT 20 11/17/2017   AST 22 11/17/2017   ALKPHOS 53 11/17/2017   BILITOT 0.4 11/17/2017   No results found for: HGBA1C No results found for: INSULIN No results found for: TSH No results found for: CHOL, HDL, LDLCALC, LDLDIRECT, TRIG, CHOLHDL Lab Results  Component Value Date   WBC 4.9 11/17/2017   HGB 14.9 11/17/2017   HCT 44.1 11/17/2017   MCV 89.5 11/17/2017   PLT 187 11/17/2017   No results found for: IRON, TIBC, FERRITIN  Attestation Statements:   Reviewed by clinician on day of visit: allergies, medications, problem list, medical history, surgical history, family history, social history, and previous encounter notes.   I, 01/18/2018, am acting as transcriptionist for Burt Knack, MD.  I have reviewed the above documentation for accuracy and completeness, and I agree with the above. - Quillian Quince, MD

## 2019-07-20 LAB — HEMOGLOBIN A1C
Est. average glucose Bld gHb Est-mCnc: 114 mg/dL
Hgb A1c MFr Bld: 5.6 % (ref 4.8–5.6)

## 2019-07-20 LAB — COMPREHENSIVE METABOLIC PANEL
ALT: 15 IU/L (ref 0–32)
AST: 18 IU/L (ref 0–40)
Albumin/Globulin Ratio: 2.4 — ABNORMAL HIGH (ref 1.2–2.2)
Albumin: 4.7 g/dL (ref 3.8–4.8)
Alkaline Phosphatase: 47 IU/L (ref 39–117)
BUN/Creatinine Ratio: 18 (ref 9–23)
BUN: 14 mg/dL (ref 6–24)
Bilirubin Total: 0.3 mg/dL (ref 0.0–1.2)
CO2: 24 mmol/L (ref 20–29)
Calcium: 9.4 mg/dL (ref 8.7–10.2)
Chloride: 106 mmol/L (ref 96–106)
Creatinine, Ser: 0.77 mg/dL (ref 0.57–1.00)
GFR calc Af Amer: 109 mL/min/{1.73_m2} (ref >59)
GFR calc non Af Amer: 94 mL/min/{1.73_m2} (ref >59)
Globulin, Total: 2 g/dL (ref 1.5–4.5)
Glucose: 92 mg/dL (ref 65–99)
Potassium: 4.4 mmol/L (ref 3.5–5.2)
Sodium: 142 mmol/L (ref 134–144)
Total Protein: 6.7 g/dL (ref 6.0–8.5)

## 2019-07-20 LAB — CBC WITH DIFFERENTIAL/PLATELET
Basophils Absolute: 0.1 10*3/uL (ref 0.0–0.2)
Basos: 1 %
EOS (ABSOLUTE): 0.2 10*3/uL (ref 0.0–0.4)
Eos: 5 %
Hematocrit: 42.2 % (ref 34.0–46.6)
Hemoglobin: 14.4 g/dL (ref 11.1–15.9)
Immature Grans (Abs): 0 10*3/uL (ref 0.0–0.1)
Immature Granulocytes: 0 %
Lymphocytes Absolute: 1.8 10*3/uL (ref 0.7–3.1)
Lymphs: 41 %
MCH: 30.1 pg (ref 26.6–33.0)
MCHC: 34.1 g/dL (ref 31.5–35.7)
MCV: 88 fL (ref 79–97)
Monocytes Absolute: 0.4 10*3/uL (ref 0.1–0.9)
Monocytes: 10 %
Neutrophils Absolute: 1.9 10*3/uL (ref 1.4–7.0)
Neutrophils: 43 %
Platelets: 194 10*3/uL (ref 150–450)
RBC: 4.79 x10E6/uL (ref 3.77–5.28)
RDW: 13 % (ref 11.7–15.4)
WBC: 4.4 10*3/uL (ref 3.4–10.8)

## 2019-07-20 LAB — LIPID PANEL WITH LDL/HDL RATIO
Cholesterol, Total: 248 mg/dL — ABNORMAL HIGH (ref 100–199)
HDL: 52 mg/dL (ref >39)
LDL Chol Calc (NIH): 166 mg/dL — ABNORMAL HIGH (ref 0–99)
LDL/HDL Ratio: 3.2 ratio (ref 0.0–3.2)
Triglycerides: 162 mg/dL — ABNORMAL HIGH (ref 0–149)
VLDL Cholesterol Cal: 30 mg/dL (ref 5–40)

## 2019-07-20 LAB — T3: T3, Total: 125 ng/dL (ref 71–180)

## 2019-07-20 LAB — VITAMIN D 25 HYDROXY (VIT D DEFICIENCY, FRACTURES): Vit D, 25-Hydroxy: 25.2 ng/mL — ABNORMAL LOW (ref 30.0–100.0)

## 2019-07-20 LAB — INSULIN, RANDOM: INSULIN: 13.3 u[IU]/mL (ref 2.6–24.9)

## 2019-07-20 LAB — FOLATE: Folate: 17.7 ng/mL (ref >3.0)

## 2019-07-20 LAB — T4, FREE: Free T4: 1.32 ng/dL (ref 0.82–1.77)

## 2019-07-20 LAB — TSH: TSH: 2.46 u[IU]/mL (ref 0.450–4.500)

## 2019-07-20 LAB — VITAMIN B12: Vitamin B-12: 609 pg/mL (ref 232–1245)

## 2019-08-02 ENCOUNTER — Other Ambulatory Visit: Payer: Self-pay

## 2019-08-02 ENCOUNTER — Encounter (INDEPENDENT_AMBULATORY_CARE_PROVIDER_SITE_OTHER): Payer: Self-pay | Admitting: Family Medicine

## 2019-08-02 ENCOUNTER — Ambulatory Visit (INDEPENDENT_AMBULATORY_CARE_PROVIDER_SITE_OTHER): Payer: 59 | Admitting: Family Medicine

## 2019-08-02 VITALS — BP 105/70 | HR 63 | Temp 97.8°F | Ht 64.0 in | Wt 197.0 lb

## 2019-08-02 DIAGNOSIS — Z9189 Other specified personal risk factors, not elsewhere classified: Secondary | ICD-10-CM

## 2019-08-02 DIAGNOSIS — E669 Obesity, unspecified: Secondary | ICD-10-CM

## 2019-08-02 DIAGNOSIS — Z6833 Body mass index (BMI) 33.0-33.9, adult: Secondary | ICD-10-CM

## 2019-08-02 DIAGNOSIS — E8881 Metabolic syndrome: Secondary | ICD-10-CM

## 2019-08-02 DIAGNOSIS — E7849 Other hyperlipidemia: Secondary | ICD-10-CM | POA: Diagnosis not present

## 2019-08-02 DIAGNOSIS — E559 Vitamin D deficiency, unspecified: Secondary | ICD-10-CM

## 2019-08-02 MED ORDER — VITAMIN D (ERGOCALCIFEROL) 1.25 MG (50000 UNIT) PO CAPS: 50000.0000 [IU] | ORAL_CAPSULE | ORAL | 0 refills | Status: DC

## 2019-08-04 NOTE — Progress Notes (Signed)
Chief Complaint:   OBESITY Leah Mason is here to discuss her progress with her obesity treatment plan along with follow-up of her obesity related diagnoses. Leah Mason is on the Category 2 Plan and states she is following her eating plan approximately 85-90% of the time. Leah Mason states she is walking 10,000 steps 7 times per week.  Today's visit was #: 2 Starting weight: 202 lbs Starting date: 07/19/2019 Today's weight: 197 lbs Today's date: 08/02/2019 Total lbs lost to date: 5 Total lbs lost since last in-office visit: 5  Interim History: Leah Mason denies hunger, however she reports finding it challenging to consume all of the prescribed food especially for dinner. She has been walking >10,000 steps daily and would like to increase exercise intensity and frequency.  Subjective:   1. Other hyperlipidemia Leah Mason's triglycerides and LDL is elevated, and total cholesterol, but normal HDL. She is not on statin therapy. I discussed labs with the patient today.  2. Vitamin D deficiency Leah Mason's Vit D level is 25, she is currently on Vit D supplementation OTC 1,000 IU q daily. I discussed labs with the patient today.  3. Insulin resistance Leah Mason's fasting insulin is 13.3. She denies polyphagia. I discussed labs with the patient today.  4. At risk for diabetes mellitus Leah Mason is at higher than average risk for developing diabetes due to insulin resistance.  Assessment/Plan:   1. Other hyperlipidemia Cardiovascular risk and specific lipid/LDL goals reviewed. We discussed several lifestyle modifications today and Leah Mason will continue her Category 2 plan, and will continue to work on exercise and weight loss efforts. We will recheck labs in 3 months. Orders and follow up as documented in patient record.   2. Vitamin D deficiency Low Vitamin D level contributes to fatigue and are associated with obesity, breast, and colon cancer. Leah Mason agreed to start prescription Vitamin D 50,000 IU every week with no refills. She  will follow-up for routine testing of Vitamin D, at least 2-3 times per year to avoid over-replacement.  - Vitamin D, Ergocalciferol, (DRISDOL) 1.25 MG (50000 UNIT) CAPS capsule; Take 1 capsule (50,000 Units total) by mouth every 7 (seven) days.  Dispense: 4 capsule; Refill: 0  3. Insulin resistance Leah Mason will continue her Category 2 plan, and will continue to work on weight loss, exercise, and decreasing simple carbohydrates to help decrease the risk of diabetes. She was encouraged to continue regular exercise, 150 minutes per week. We will recheck labs in 3 months. Leah Mason agreed to follow-up with [place] as directed to closely monitor her progress.  4. At risk for diabetes mellitus Leah Mason was given approximately 30 minutes of diabetes education and counseling today. We discussed intensive lifestyle modifications today with an emphasis on weight loss as well as increasing exercise and decreasing simple carbohydrates in her diet. We also reviewed medication options with an emphasis on risk versus benefit of those discussed.   Repetitive spaced learning was employed today to elicit superior memory formation and behavioral change.  5. Class 1 obesity with serious comorbidity and body mass index (BMI) of 33.0 to 33.9 in adult, unspecified obesity type Leah Mason is currently in the action stage of change. As such, her goal is to continue with weight loss efforts. She has agreed to the Category 2 Plan.   Exercise goals: For substantial health benefits, adults should do at least 150 minutes (2 hours and 30 minutes) a week of moderate-intensity, or 75 minutes (1 hour and 15 minutes) a week of vigorous-intensity aerobic physical activity, or an  equivalent combination of moderate- and vigorous-intensity aerobic activity. Aerobic activity should be performed in episodes of at least 10 minutes, and preferably, it should be spread throughout the week.  Behavioral modification strategies: increasing lean protein intake and  decreasing liquid calories.  Leah Mason has agreed to follow-up with our clinic in 2 weeks. She was informed of the importance of frequent follow-up visits to maximize her success with intensive lifestyle modifications for her multiple health conditions.   Objective:   Blood pressure 105/70, pulse 63, temperature 97.8 F (36.6 C), temperature source Oral, height 5\' 4"  (1.626 m), weight 197 lb (89.4 kg), last menstrual period 04/23/2015, SpO2 97 %. Body mass index is 33.81 kg/m.  General: Cooperative, alert, well developed, in no acute distress. HEENT: Conjunctivae and lids unremarkable. Cardiovascular: Regular rhythm.  Lungs: Normal work of breathing. Neurologic: No focal deficits.   Lab Results  Component Value Date   CREATININE 0.77 07/19/2019   BUN 14 07/19/2019   NA 142 07/19/2019   K 4.4 07/19/2019   CL 106 07/19/2019   CO2 24 07/19/2019   Lab Results  Component Value Date   ALT 15 07/19/2019   AST 18 07/19/2019   ALKPHOS 47 07/19/2019   BILITOT 0.3 07/19/2019   Lab Results  Component Value Date   HGBA1C 5.6 07/19/2019   Lab Results  Component Value Date   INSULIN 13.3 07/19/2019   Lab Results  Component Value Date   TSH 2.460 07/19/2019   Lab Results  Component Value Date   CHOL 248 (H) 07/19/2019   HDL 52 07/19/2019   LDLCALC 166 (H) 07/19/2019   TRIG 162 (H) 07/19/2019   Lab Results  Component Value Date   WBC 4.4 07/19/2019   HGB 14.4 07/19/2019   HCT 42.2 07/19/2019   MCV 88 07/19/2019   PLT 194 07/19/2019   No results found for: IRON, TIBC, FERRITIN  Attestation Statements:   Reviewed by clinician on day of visit: allergies, medications, problem list, medical history, surgical history, family history, social history, and previous encounter notes.   I, Trixie Dredge, am acting as transcriptionist for Dennard Nip, MD.  I have reviewed the above documentation for accuracy and completeness, and I agree with the above. -  Dennard Nip, MD

## 2019-08-18 ENCOUNTER — Encounter (INDEPENDENT_AMBULATORY_CARE_PROVIDER_SITE_OTHER): Payer: Self-pay | Admitting: Family Medicine

## 2019-08-18 ENCOUNTER — Ambulatory Visit (INDEPENDENT_AMBULATORY_CARE_PROVIDER_SITE_OTHER): Payer: 59 | Admitting: Family Medicine

## 2019-08-18 ENCOUNTER — Other Ambulatory Visit: Payer: Self-pay

## 2019-08-18 VITALS — BP 104/67 | HR 70 | Temp 97.7°F | Ht 64.0 in | Wt 196.0 lb

## 2019-08-18 DIAGNOSIS — Z6833 Body mass index (BMI) 33.0-33.9, adult: Secondary | ICD-10-CM | POA: Diagnosis not present

## 2019-08-18 DIAGNOSIS — E669 Obesity, unspecified: Secondary | ICD-10-CM | POA: Diagnosis not present

## 2019-08-18 DIAGNOSIS — E559 Vitamin D deficiency, unspecified: Secondary | ICD-10-CM | POA: Diagnosis not present

## 2019-08-18 MED ORDER — VITAMIN D (ERGOCALCIFEROL) 1.25 MG (50000 UNIT) PO CAPS: 50000.0000 [IU] | ORAL_CAPSULE | ORAL | 0 refills | Status: DC

## 2019-08-18 NOTE — Progress Notes (Signed)
Chief Complaint:   OBESITY Leah Mason is here to discuss her progress with her obesity treatment plan along with follow-up of her obesity related diagnoses. Leah Mason is on the Category 2 Plan and states she is following her eating plan approximately 80% of the time. Leah Mason states she is doing 0 minutes 0 times per week.  Today's visit was #: 3 Starting weight: 202 lbs Starting date: 07/19/2019 Today's weight: 196 lbs Today's date: 08/18/2019 Total lbs lost to date: 6 Total lbs lost since last in-office visit: 1  Interim History: Leah Mason feels that she will fall off the program 2 out of 7 days of the week. She reports very low hunger levels. She recently returned from a beach trip with the family and was unable to follow the plan during travel.  Subjective:   1. Vitamin D deficiency Leah Mason's Vit D level on 07/19/2019 was 25.2. She is currently on prescription strength Vit D supplementation.  Assessment/Plan:   1. Vitamin D deficiency Low Vitamin D level contributes to fatigue and are associated with obesity, breast, and colon cancer. We will refill prescription Vitamin D for 1 month. Leah Mason will follow-up for routine testing of Vitamin D, at least 2-3 times per year to avoid over-replacement.  - Vitamin D, Ergocalciferol, (DRISDOL) 1.25 MG (50000 UNIT) CAPS capsule; Take 1 capsule (50,000 Units total) by mouth every 7 (seven) days.  Dispense: 4 capsule; Refill: 0  2. Class 1 obesity with serious comorbidity and body mass index (BMI) of 33.0 to 33.9 in adult, unspecified obesity type Leah Mason is currently in the action stage of change. As such, her goal is to continue with weight loss efforts. She has agreed to the Category 2 Plan.   Exercise goals: As is, walking.  Behavioral modification strategies: increasing lean protein intake, no skipping meals and meal planning and cooking strategies.  Leah Mason has agreed to follow-up with our clinic in 3 weeks. She was informed of the importance of frequent follow-up  visits to maximize her success with intensive lifestyle modifications for her multiple health conditions.   Objective:   Blood pressure 104/67, pulse 70, temperature 97.7 F (36.5 C), temperature source Oral, height 5\' 4"  (1.626 m), weight 196 lb (88.9 kg), last menstrual period 04/23/2015, SpO2 100 %. Body mass index is 33.64 kg/m.  General: Cooperative, alert, well developed, in no acute distress. HEENT: Conjunctivae and lids unremarkable. Cardiovascular: Regular rhythm.  Lungs: Normal work of breathing. Neurologic: No focal deficits.   Lab Results  Component Value Date   CREATININE 0.77 07/19/2019   BUN 14 07/19/2019   NA 142 07/19/2019   K 4.4 07/19/2019   CL 106 07/19/2019   CO2 24 07/19/2019   Lab Results  Component Value Date   ALT 15 07/19/2019   AST 18 07/19/2019   ALKPHOS 47 07/19/2019   BILITOT 0.3 07/19/2019   Lab Results  Component Value Date   HGBA1C 5.6 07/19/2019   Lab Results  Component Value Date   INSULIN 13.3 07/19/2019   Lab Results  Component Value Date   TSH 2.460 07/19/2019   Lab Results  Component Value Date   CHOL 248 (H) 07/19/2019   HDL 52 07/19/2019   LDLCALC 166 (H) 07/19/2019   TRIG 162 (H) 07/19/2019   Lab Results  Component Value Date   WBC 4.4 07/19/2019   HGB 14.4 07/19/2019   HCT 42.2 07/19/2019   MCV 88 07/19/2019   PLT 194 07/19/2019   No results found for: IRON, TIBC,  FERRITIN  Attestation Statements:   Reviewed by clinician on day of visit: allergies, medications, problem list, medical history, surgical history, family history, social history, and previous encounter notes.   I, Leah Mason, am acting as transcriptionist for Quillian Quince, MD.  I have reviewed the above documentation for accuracy and completeness, and I agree with the above. -  Quillian Quince, MD

## 2019-08-26 ENCOUNTER — Other Ambulatory Visit: Payer: Self-pay | Admitting: Family Medicine

## 2019-08-26 DIAGNOSIS — Z1231 Encounter for screening mammogram for malignant neoplasm of breast: Secondary | ICD-10-CM

## 2019-08-29 ENCOUNTER — Inpatient Hospital Stay: Admission: RE | Admit: 2019-08-29 | Payer: Self-pay | Source: Ambulatory Visit

## 2019-08-29 ENCOUNTER — Ambulatory Visit: Payer: Self-pay

## 2019-08-29 ENCOUNTER — Other Ambulatory Visit: Payer: Self-pay

## 2019-08-29 ENCOUNTER — Ambulatory Visit
Admission: RE | Admit: 2019-08-29 | Discharge: 2019-08-29 | Disposition: A | Discharge status: Home / Self Care | Payer: 59 | Source: Ambulatory Visit | Attending: Family Medicine | Admitting: Family Medicine

## 2019-08-29 DIAGNOSIS — Z1231 Encounter for screening mammogram for malignant neoplasm of breast: Secondary | ICD-10-CM

## 2019-09-12 ENCOUNTER — Ambulatory Visit (INDEPENDENT_AMBULATORY_CARE_PROVIDER_SITE_OTHER): Payer: 59 | Admitting: Family Medicine

## 2019-09-12 ENCOUNTER — Encounter (INDEPENDENT_AMBULATORY_CARE_PROVIDER_SITE_OTHER): Payer: Self-pay | Admitting: Family Medicine

## 2019-09-12 ENCOUNTER — Other Ambulatory Visit: Payer: Self-pay

## 2019-09-12 VITALS — BP 105/69 | HR 73 | Temp 97.6°F | Ht 64.0 in | Wt 196.0 lb

## 2019-09-12 DIAGNOSIS — E669 Obesity, unspecified: Secondary | ICD-10-CM | POA: Diagnosis not present

## 2019-09-12 DIAGNOSIS — E559 Vitamin D deficiency, unspecified: Secondary | ICD-10-CM

## 2019-09-12 DIAGNOSIS — Z6833 Body mass index (BMI) 33.0-33.9, adult: Secondary | ICD-10-CM

## 2019-09-14 NOTE — Progress Notes (Signed)
Chief Complaint:   OBESITY Leah Mason is here to discuss her progress with her obesity treatment plan along with follow-up of her obesity related diagnoses. Leah Mason is on the Category 2 Plan and states she is following her eating plan approximately 80% of the time. Leah Mason states she is walking for 20 minutes 7 times per week.  Today's visit was #: 4 Starting weight: 202 lbs Starting date: 07/19/2019 Today's weight: 196 lbs Today's date: 09/12/2019 Total lbs lost to date: 6 Total lbs lost since last in-office visit: 0  Interim History: Leah Mason has done well maintaining her weight, but she is struggling with long hours and increased work stress and her daughter wants to eat vegetarian which makes planning meals complicated.  Subjective:   1. Vitamin D deficiency Leah Mason is stable on Vit D OTC, and she denies nausea, vomiting, or muscle weakness.  Assessment/Plan:   1. Vitamin D deficiency Low Vitamin D level contributes to fatigue and are associated with obesity, breast, and colon cancer. Leah Mason agreed to continue taking OTC Vitamin D and will follow-up for routine testing of Vitamin D, at least 2-3 times per year to avoid over-replacement. We will recheck labs in 2 months.  2. Class 1 obesity with serious comorbidity and body mass index (BMI) of 33.0 to 33.9 in adult, unspecified obesity type Leah Mason is currently in the action stage of change. As such, her goal is to continue with weight loss efforts. She has agreed to keeping a food journal and adhering to recommended goals of 1300-1500 calories and 80+ grams of protein daily.  High protein options including vegetarian options were given.   Exercise goals: As is.  Behavioral modification strategies: increasing lean protein intake.  Leah Mason has agreed to follow-up with our clinic in 2 to 3 weeks. She was informed of the importance of frequent follow-up visits to maximize her success with intensive lifestyle modifications for her multiple health  conditions.   Objective:   Blood pressure 105/69, pulse 73, temperature 97.6 F (36.4 C), temperature source Oral, height 5\' 4"  (1.626 m), weight 196 lb (88.9 kg), last menstrual period 04/23/2015, SpO2 99 %. Body mass index is 33.64 kg/m.  General: Cooperative, alert, well developed, in no acute distress. HEENT: Conjunctivae and lids unremarkable. Cardiovascular: Regular rhythm.  Lungs: Normal work of breathing. Neurologic: No focal deficits.   Lab Results  Component Value Date   CREATININE 0.77 07/19/2019   BUN 14 07/19/2019   NA 142 07/19/2019   K 4.4 07/19/2019   CL 106 07/19/2019   CO2 24 07/19/2019   Lab Results  Component Value Date   ALT 15 07/19/2019   AST 18 07/19/2019   ALKPHOS 47 07/19/2019   BILITOT 0.3 07/19/2019   Lab Results  Component Value Date   HGBA1C 5.6 07/19/2019   Lab Results  Component Value Date   INSULIN 13.3 07/19/2019   Lab Results  Component Value Date   TSH 2.460 07/19/2019   Lab Results  Component Value Date   CHOL 248 (H) 07/19/2019   HDL 52 07/19/2019   LDLCALC 166 (H) 07/19/2019   TRIG 162 (H) 07/19/2019   Lab Results  Component Value Date   WBC 4.4 07/19/2019   HGB 14.4 07/19/2019   HCT 42.2 07/19/2019   MCV 88 07/19/2019   PLT 194 07/19/2019   No results found for: IRON, TIBC, FERRITIN  Attestation Statements:   Reviewed by clinician on day of visit: allergies, medications, problem list, medical history, surgical history, family  history, social history, and previous encounter notes.  Time spent on visit including pre-visit chart review and post-visit care and charting was 31 minutes.    I, Trixie Dredge, am acting as transcriptionist for Dennard Nip, MD.  I have reviewed the above documentation for accuracy and completeness, and I agree with the above. -  Dennard Nip, MD

## 2019-09-17 DIAGNOSIS — E8881 Metabolic syndrome: Secondary | ICD-10-CM

## 2019-09-17 DIAGNOSIS — E88819 Insulin resistance, unspecified: Secondary | ICD-10-CM

## 2019-09-17 HISTORY — DX: Insulin resistance, unspecified: E88.819

## 2019-09-17 HISTORY — DX: Metabolic syndrome: E88.81

## 2019-10-03 ENCOUNTER — Other Ambulatory Visit: Payer: Self-pay

## 2019-10-03 ENCOUNTER — Encounter (INDEPENDENT_AMBULATORY_CARE_PROVIDER_SITE_OTHER): Payer: Self-pay | Admitting: Family Medicine

## 2019-10-03 ENCOUNTER — Ambulatory Visit (INDEPENDENT_AMBULATORY_CARE_PROVIDER_SITE_OTHER): Payer: 59 | Admitting: Family Medicine

## 2019-10-03 VITALS — BP 105/65 | HR 66 | Temp 97.9°F | Ht 64.0 in | Wt 198.0 lb

## 2019-10-03 DIAGNOSIS — E559 Vitamin D deficiency, unspecified: Secondary | ICD-10-CM | POA: Diagnosis not present

## 2019-10-03 DIAGNOSIS — E7849 Other hyperlipidemia: Secondary | ICD-10-CM | POA: Diagnosis not present

## 2019-10-03 DIAGNOSIS — Z9189 Other specified personal risk factors, not elsewhere classified: Secondary | ICD-10-CM

## 2019-10-03 DIAGNOSIS — E8881 Metabolic syndrome: Secondary | ICD-10-CM | POA: Diagnosis not present

## 2019-10-03 DIAGNOSIS — Z6834 Body mass index (BMI) 34.0-34.9, adult: Secondary | ICD-10-CM

## 2019-10-03 DIAGNOSIS — E669 Obesity, unspecified: Secondary | ICD-10-CM

## 2019-10-03 MED ORDER — VITAMIN D (ERGOCALCIFEROL) 1.25 MG (50000 UNIT) PO CAPS: 50000.0000 [IU] | ORAL_CAPSULE | ORAL | 0 refills | Status: DC

## 2019-10-03 NOTE — Progress Notes (Signed)
Chief Complaint:   OBESITY Leah Mason is here to discuss her progress with her obesity treatment plan along with follow-up of her obesity related diagnoses. Leah Mason is on keeping a food journal and adhering to recommended goals of 1300-1500 calories and 80+ grams of protein daily and states she is following her eating plan approximately 50% of the time. Leah Mason states she is on the treadmill for 40 minutes 1 time per week.  Today's visit was #: 5 Starting weight: 202 lbs Starting date: 07/19/2019 Today's weight: 198 lbs Today's date: 10/03/2019 Total lbs lost to date: 4 Total lbs lost since last in-office visit: 0  Interim History: Leah Mason has been struggling with consuming enough calories and has been inconsistently tracking her calories. When reviewing [employer], her calorie intake typically ranged between 600 and 1200 calories per day.  Subjective:   1. Vitamin D deficiency Leah Mason's Vit D level on 07/19/2019 was 25.2. She is on prescription strength Vit D supplementation, and is tolerating it well.  2. Other hyperlipidemia Leah Mason's lipid panel on 07/19/2019, showed a total cholesterol of 248, HDL 52, triglycerides 162, and LDL 166. She is not on statin therapy.  3. Insulin resistance Leah Mason's A1c on 07/19/2019 was 5.6 and insulin level 13.3. She has very low hunger levels throughout the day. She is not on metformin.  4. At risk for heart disease Leah Mason is at a higher than average risk for cardiovascular disease due to obesity and hyperlipidemia.   Assessment/Plan:   1. Vitamin D deficiency Low Vitamin D level contributes to fatigue and are associated with obesity, breast, and colon cancer. We will refill prescription Vitamin D for 1 month. We will recheck labs at her next office visit. Leah Mason will follow-up for routine testing of Vitamin D, at least 2-3 times per year to avoid over-replacement.  - Vitamin D, Ergocalciferol, (DRISDOL) 1.25 MG (50000 UNIT) CAPS capsule; Take 1 capsule (50,000 Units  total) by mouth every 7 (seven) days.  Dispense: 4 capsule; Refill: 0  2. Other hyperlipidemia Cardiovascular risk and specific lipid/LDL goals reviewed. We discussed several lifestyle modifications today. Leah Mason will restart Category 2 meal plan, and will continue to work on exercise and weight loss efforts. We will recheck labs at her next office visit. Orders and follow up as documented in patient record.   3. Insulin resistance Leah Mason will restart Category 2 meal plan, and will continue to work on weight loss, exercise, and decreasing simple carbohydrates to help decrease the risk of diabetes. We will recheck labs at her next office visit. Leah Mason agreed to follow-up with [place] as directed to closely monitor her progress.  4. At risk for heart disease Leah Mason was given approximately 15 minutes of coronary artery disease prevention counseling today. She is 44 y.o. female and has risk factors for heart disease including obesity and hyperlipidemia. We discussed intensive lifestyle modifications today with an emphasis on specific weight loss instructions and strategies.   Repetitive spaced learning was employed today to elicit superior memory formation and behavioral change.  5. Class 1 obesity with serious comorbidity and body mass index (BMI) of 34.0 to 34.9 in adult, unspecified obesity type Leah Mason is currently in the action stage of change. As such, her goal is to continue with weight loss efforts. She has agreed to the Category 2 Plan.   Exercise goals: As is.  Behavioral modification strategies: increasing lean protein intake, decreasing simple carbohydrates, increasing water intake, decreasing eating out, no skipping meals and meal planning and cooking  strategies.  Leah Mason has agreed to follow-up with our clinic in 3 weeks. She was informed of the importance of frequent follow-up visits to maximize her success with intensive lifestyle modifications for her multiple health conditions.   Objective:    Blood pressure 105/65, pulse 66, temperature 97.9 F (36.6 C), temperature source Oral, height 5\' 4"  (1.626 m), weight 198 lb (89.8 kg), last menstrual period 04/23/2015, SpO2 99 %. Body mass index is 33.99 kg/m.  General: Cooperative, alert, well developed, in no acute distress. HEENT: Conjunctivae and lids unremarkable. Cardiovascular: Regular rhythm.  Lungs: Normal work of breathing. Neurologic: No focal deficits.   Lab Results  Component Value Date   CREATININE 0.77 07/19/2019   BUN 14 07/19/2019   NA 142 07/19/2019   K 4.4 07/19/2019   CL 106 07/19/2019   CO2 24 07/19/2019   Lab Results  Component Value Date   ALT 15 07/19/2019   AST 18 07/19/2019   ALKPHOS 47 07/19/2019   BILITOT 0.3 07/19/2019   Lab Results  Component Value Date   HGBA1C 5.6 07/19/2019   Lab Results  Component Value Date   INSULIN 13.3 07/19/2019   Lab Results  Component Value Date   TSH 2.460 07/19/2019   Lab Results  Component Value Date   CHOL 248 (H) 07/19/2019   HDL 52 07/19/2019   LDLCALC 166 (H) 07/19/2019   TRIG 162 (H) 07/19/2019   Lab Results  Component Value Date   WBC 4.4 07/19/2019   HGB 14.4 07/19/2019   HCT 42.2 07/19/2019   MCV 88 07/19/2019   PLT 194 07/19/2019   No results found for: IRON, TIBC, FERRITIN  Attestation Statements:   Reviewed by clinician on day of visit: allergies, medications, problem list, medical history, surgical history, family history, social history, and previous encounter notes.   I, Trixie Dredge, am acting as transcriptionist for Dennard Nip, MD.  I have reviewed the above documentation for accuracy and completeness, and I agree with the above. -  Dennard Nip, MD

## 2019-10-04 ENCOUNTER — Other Ambulatory Visit (INDEPENDENT_AMBULATORY_CARE_PROVIDER_SITE_OTHER): Payer: Self-pay | Admitting: Family Medicine

## 2019-10-04 DIAGNOSIS — E559 Vitamin D deficiency, unspecified: Secondary | ICD-10-CM

## 2019-10-27 ENCOUNTER — Other Ambulatory Visit (INDEPENDENT_AMBULATORY_CARE_PROVIDER_SITE_OTHER): Payer: Self-pay | Admitting: Family Medicine

## 2019-10-27 DIAGNOSIS — E559 Vitamin D deficiency, unspecified: Secondary | ICD-10-CM

## 2019-10-31 ENCOUNTER — Other Ambulatory Visit: Payer: Self-pay

## 2019-10-31 ENCOUNTER — Encounter (INDEPENDENT_AMBULATORY_CARE_PROVIDER_SITE_OTHER): Payer: Self-pay | Admitting: Family Medicine

## 2019-10-31 ENCOUNTER — Ambulatory Visit (INDEPENDENT_AMBULATORY_CARE_PROVIDER_SITE_OTHER): Payer: 59 | Admitting: Family Medicine

## 2019-10-31 VITALS — BP 100/66 | HR 72 | Temp 97.7°F | Ht 64.0 in | Wt 196.0 lb

## 2019-10-31 DIAGNOSIS — Z9189 Other specified personal risk factors, not elsewhere classified: Secondary | ICD-10-CM | POA: Diagnosis not present

## 2019-10-31 DIAGNOSIS — E669 Obesity, unspecified: Secondary | ICD-10-CM

## 2019-10-31 DIAGNOSIS — E8881 Metabolic syndrome: Secondary | ICD-10-CM

## 2019-10-31 DIAGNOSIS — E7849 Other hyperlipidemia: Secondary | ICD-10-CM

## 2019-10-31 DIAGNOSIS — E559 Vitamin D deficiency, unspecified: Secondary | ICD-10-CM

## 2019-10-31 DIAGNOSIS — Z6833 Body mass index (BMI) 33.0-33.9, adult: Secondary | ICD-10-CM

## 2019-10-31 MED ORDER — VITAMIN D (ERGOCALCIFEROL) 1.25 MG (50000 UNIT) PO CAPS: 50000.0000 [IU] | ORAL_CAPSULE | ORAL | 0 refills | Status: DC

## 2019-11-01 LAB — INSULIN, RANDOM: INSULIN: 9 u[IU]/mL (ref 2.6–24.9)

## 2019-11-01 LAB — COMPREHENSIVE METABOLIC PANEL
ALT: 13 IU/L (ref 0–32)
AST: 12 IU/L (ref 0–40)
Albumin/Globulin Ratio: 2.5 — ABNORMAL HIGH (ref 1.2–2.2)
Albumin: 4.7 g/dL (ref 3.8–4.8)
Alkaline Phosphatase: 45 IU/L — ABNORMAL LOW (ref 48–121)
BUN/Creatinine Ratio: 19 (ref 9–23)
BUN: 16 mg/dL (ref 6–24)
Bilirubin Total: 0.3 mg/dL (ref 0.0–1.2)
CO2: 26 mmol/L (ref 20–29)
Calcium: 9.4 mg/dL (ref 8.7–10.2)
Chloride: 107 mmol/L — ABNORMAL HIGH (ref 96–106)
Creatinine, Ser: 0.84 mg/dL (ref 0.57–1.00)
GFR calc Af Amer: 98 mL/min/{1.73_m2} (ref >59)
GFR calc non Af Amer: 85 mL/min/{1.73_m2} (ref >59)
Globulin, Total: 1.9 g/dL (ref 1.5–4.5)
Glucose: 88 mg/dL (ref 65–99)
Potassium: 4.6 mmol/L (ref 3.5–5.2)
Sodium: 144 mmol/L (ref 134–144)
Total Protein: 6.6 g/dL (ref 6.0–8.5)

## 2019-11-01 LAB — LIPID PANEL WITH LDL/HDL RATIO
Cholesterol, Total: 246 mg/dL — ABNORMAL HIGH (ref 100–199)
HDL: 41 mg/dL (ref >39)
LDL Chol Calc (NIH): 176 mg/dL — ABNORMAL HIGH (ref 0–99)
LDL/HDL Ratio: 4.3 ratio — ABNORMAL HIGH (ref 0.0–3.2)
Triglycerides: 156 mg/dL — ABNORMAL HIGH (ref 0–149)
VLDL Cholesterol Cal: 29 mg/dL (ref 5–40)

## 2019-11-01 LAB — HEMOGLOBIN A1C
Est. average glucose Bld gHb Est-mCnc: 117 mg/dL
Hgb A1c MFr Bld: 5.7 % — ABNORMAL HIGH (ref 4.8–5.6)

## 2019-11-01 LAB — VITAMIN D 25 HYDROXY (VIT D DEFICIENCY, FRACTURES): Vit D, 25-Hydroxy: 34.3 ng/mL (ref 30.0–100.0)

## 2019-11-01 NOTE — Progress Notes (Signed)
Chief Complaint:   OBESITY Leah Mason is here to discuss her progress with her obesity treatment plan along with follow-up of her obesity related diagnoses. Leah Mason is on the Category 2 Plan and states she is following her eating plan approximately 75% of the time. Leah Mason states she is pedaling her bike under her desk for 20 minutes 5 times per week.  Today's visit was #: 6 Starting weight: 202 lbs Starting date: 07/19/2019 Today's weight: 196 lbs Today's date: 10/31/2019 Total lbs lost to date: 6 Total lbs lost since last in-office visit: 2  Interim History: Leah Mason is using [employer]itness [employer] to track her calories. She is still not hitting calories and all protein goals. She is tracking 50% of the time only. She is getting [phone] calories per day on an average, which is much less than needed. She is not hungry and just doesn't eat at times.  Subjective:   1. Vitamin D deficiency Leah Mason denies side effects to medications or GI upset. She has been consistent with her medications regimen. Last Vit D level was checked on 07/19/2019. I discussed labs with the patient today.  2. Insulin resistance Leah Mason's last insulin level was 13.3 on 07/19/2019. I discussed labs with the patient today.  3. Other hyperlipidemia elevated triglycerides and LDL Leah Mason's LDL was elevated at 166, triglycerides elevated at 162, and HDL was 52. I discussed with the patient today that her LDL was elevated and it increases the risk for heart disease.  4. At risk for diabetes mellitus Leah Mason is at higher than average risk for developing diabetes due to her obesity and insulin resistance.   Assessment/Plan:   1. Vitamin D deficiency Low Vitamin D level contributes to fatigue and are associated with obesity, breast, and colon cancer. We will check labs today, and we will refill prescription Vitamin D for 1 month. Leah Mason will follow-up for routine testing of Vitamin D, at least 2-3 times per year to avoid over-replacement.  - VITAMIN D 25  Hydroxy (Vit-D Deficiency, Fractures)  - Vitamin D, Ergocalciferol, (DRISDOL) 1.25 MG (50000 UNIT) CAPS capsule; Take 1 capsule (50,000 Units total) by mouth every 7 (seven) days.  Dispense: 4 capsule; Refill: 0  2. Insulin resistance Leah Mason will continue to work on weight loss, exercise, and decreasing simple carbohydrates to help decrease the risk of diabetes. Counseling was provided today on high risk of diabetes mellitus, prevention, and strategies. We will check labs today. Leah Mason agreed to follow-up with [place] as directed to closely monitor her progress.  - Comprehensive metabolic panel - Hemoglobin A1c - Insulin, random  3. Other hyperlipidemia elevated triglycerides and LDL Cardiovascular risk and specific lipid/LDL goals reviewed. We discussed several lifestyle modifications today and Leah Mason will continue to work on diet, exercise and weight loss efforts. We will check labs today. Orders and follow up as documented in patient record.   Counseling Intensive lifestyle modifications are the first line treatment for this issue. . Dietary changes: Increase soluble fiber. Decrease simple carbohydrates. . Exercise changes: Moderate to vigorous-intensity aerobic activity 150 minutes per week if tolerated. . Lipid-lowering medications: see documented in medical record.  - Comprehensive metabolic panel - Hemoglobin A1c - Insulin, random - Lipid Panel With LDL/HDL Ratio  4. At risk for diabetes mellitus Leah Mason was given approximately 15 minutes of diabetes education and counseling today. We discussed intensive lifestyle modifications today with an emphasis on weight loss as well as increasing exercise and decreasing simple carbohydrates in her diet. We also reviewed medication  options with an emphasis on risk versus benefit of those discussed.   Repetitive spaced learning was employed today to elicit superior memory formation and behavioral change.  5. Class 1 obesity with serious comorbidity and  body mass index (BMI) of 33.0 to 33.9 in adult, unspecified obesity type Leah Mason is currently in the action stage of change. As such, her goal is to continue with weight loss efforts. She has agreed to change to the Category 2 Plan.   Exercise goals: As is.  Behavioral modification strategies: no skipping meals, meal planning and cooking strategies and planning for success.  Leah Mason has agreed to follow-up with our clinic in 4 weeks. She was informed of the importance of frequent follow-up visits to maximize her success with intensive lifestyle modifications for her multiple health conditions.   Leah Mason was informed we would discuss her lab results at her next visit unless there is a critical issue that needs to be addressed sooner. Leah Mason agreed to keep her next visit at the agreed upon time to discuss these results.  Objective:   Blood pressure 100/66, pulse 72, temperature 97.7 F (36.5 C), temperature source Oral, height 5\' 4"  (1.626 m), weight 196 lb (88.9 kg), last menstrual period 04/23/2015, SpO2 97 %. Body mass index is 33.64 kg/m.  General: Cooperative, alert, well developed, in no acute distress. HEENT: Conjunctivae and lids unremarkable. Cardiovascular: Regular rhythm.  Lungs: Normal work of breathing. Neurologic: No focal deficits.   Lab Results  Component Value Date   CREATININE 0.84 10/31/2019   BUN 16 10/31/2019   NA 144 10/31/2019   K 4.6 10/31/2019   CL 107 (H) 10/31/2019   CO2 26 10/31/2019   Lab Results  Component Value Date   ALT 13 10/31/2019   AST 12 10/31/2019   ALKPHOS 45 (L) 10/31/2019   BILITOT 0.3 10/31/2019   Lab Results  Component Value Date   HGBA1C 5.7 (H) 10/31/2019   HGBA1C 5.6 07/19/2019   Lab Results  Component Value Date   INSULIN 9.0 10/31/2019   INSULIN 13.3 07/19/2019   Lab Results  Component Value Date   TSH 2.460 07/19/2019   Lab Results  Component Value Date   CHOL 246 (H) 10/31/2019   HDL 41 10/31/2019   LDLCALC 176 (H)  10/31/2019   TRIG 156 (H) 10/31/2019   Lab Results  Component Value Date   WBC 4.4 07/19/2019   HGB 14.4 07/19/2019   HCT 42.2 07/19/2019   MCV 88 07/19/2019   PLT 194 07/19/2019   No results found for: IRON, TIBC, FERRITIN  Attestation Statements:   Reviewed by clinician on day of visit: allergies, medications, problem list, medical history, surgical history, family history, social history, and previous encounter notes.   Wilhemena Durie, am acting as transcriptionist for [employer], DO.  I have reviewed the above documentation for accuracy and completeness, and I agree with the above. Mellody Dance, DO

## 2019-11-28 ENCOUNTER — Encounter (INDEPENDENT_AMBULATORY_CARE_PROVIDER_SITE_OTHER): Payer: Self-pay | Admitting: Family Medicine

## 2019-11-28 ENCOUNTER — Other Ambulatory Visit: Payer: Self-pay

## 2019-11-28 ENCOUNTER — Ambulatory Visit (INDEPENDENT_AMBULATORY_CARE_PROVIDER_SITE_OTHER): Payer: 59 | Admitting: Family Medicine

## 2019-11-28 VITALS — BP 101/67 | HR 74 | Temp 97.7°F | Ht 64.0 in | Wt 197.0 lb

## 2019-11-28 DIAGNOSIS — E559 Vitamin D deficiency, unspecified: Secondary | ICD-10-CM

## 2019-11-28 DIAGNOSIS — R7303 Prediabetes: Secondary | ICD-10-CM

## 2019-11-28 DIAGNOSIS — E669 Obesity, unspecified: Secondary | ICD-10-CM

## 2019-11-28 DIAGNOSIS — Z6833 Body mass index (BMI) 33.0-33.9, adult: Secondary | ICD-10-CM

## 2019-11-28 DIAGNOSIS — E782 Mixed hyperlipidemia: Secondary | ICD-10-CM

## 2019-11-28 DIAGNOSIS — Z9189 Other specified personal risk factors, not elsewhere classified: Secondary | ICD-10-CM | POA: Diagnosis not present

## 2019-11-28 MED ORDER — VITAMIN D (ERGOCALCIFEROL) 1.25 MG (50000 UNIT) PO CAPS: 50000.0000 [IU] | ORAL_CAPSULE | ORAL | 0 refills | Status: DC

## 2019-11-30 NOTE — Progress Notes (Signed)
Chief Complaint:   OBESITY Leah Mason is here to discuss her progress with her obesity treatment plan along with follow-up of her obesity related diagnoses. Leah Mason is on the Category 2 Plan and states she is following her eating plan approximately 50% of the time. Leah Mason states she is doing 0 minutes 0 times per week.  Today's visit was #: 7 Starting weight: 202 lbs Starting date: 07/19/2019 Today's weight: 197 lbs Today's date: 11/28/2019 Total lbs lost to date: 5 Total lbs lost since last in-office visit: 0  Interim History: Leah Mason has been struggling to put herself first and she hasn't been following the plan as closely. She doesn't always eat all of her food and her RMR may be low due to this.  Subjective:   1. Vitamin D deficiency Leah Mason is stable on Vit D, but her last Vit D level was not at goal.   2. Pre-diabetes Leah Mason's fasting glucose and insulin have improved but her A1c is slightly elevated. She has struggled to eat all of her protein.  3. Mixed hyperlipidemia Leah Mason's LDL remains high despite losing weight, and decreased high cholesterol and fatty foods. She denies chest pain.  4. At risk for impaired metabolic function Leah Mason is at increased risk for impaired metabolic function due to current nutrition and muscle mass.  Assessment/Plan:   1. Vitamin D deficiency Low Vitamin D level contributes to fatigue and are associated with obesity, breast, and colon cancer. We will refill prescription Vitamin D for 1 month. Leah Mason will follow-up for routine testing of Vitamin D, at least 2-3 times per year to avoid over-replacement. We will recheck labs in 3 months.   - Vitamin D, Ergocalciferol, (DRISDOL) 1.25 MG (50000 UNIT) CAPS capsule; Take 1 capsule (50,000 Units total) by mouth every 7 (seven) days.  Dispense: 4 capsule; Refill: 0  2. Pre-diabetes Leah Mason will continue to work on weight loss, diet, exercise, and decreasing simple carbohydrates to help decrease the risk of diabetes. We will  recheck labs in 3 months.  3. Mixed hyperlipidemia Cardiovascular risk and specific lipid/LDL goals reviewed. We discussed several lifestyle modifications today. Leah Mason was educated on lifestyle versus genetics, and will continue to monitor. Leah Mason will continue to work on diet, exercise and weight loss efforts. Orders and follow up as documented in patient record.   4. At risk for impaired metabolic function Leah Mason was given approximately 15 minutes of impaired  metabolic function prevention counseling today. We discussed intensive lifestyle modifications today with an emphasis on specific nutrition and exercise instructions and strategies.   Repetitive spaced learning was employed today to elicit superior memory formation and behavioral change.  5. Class 1 obesity with serious comorbidity and body mass index (BMI) of 33.0 to 33.9 in adult, unspecified obesity type Leah Mason is currently in the action stage of change. As such, her goal is to continue with weight loss efforts. She has agreed to keeping a food journal and adhering to recommended goals of 1200-1400 calories and 80+ grams of protein daily.   Behavioral modification strategies: increasing lean protein intake, decreasing simple carbohydrates and no skipping meals.  Leah Mason has agreed to follow-up with our clinic in 2 to 3 weeks. She was informed of the importance of frequent follow-up visits to maximize her success with intensive lifestyle modifications for her multiple health conditions.   Objective:   Blood pressure 101/67, pulse 74, temperature 97.7 F (36.5 C), temperature source Oral, height 5\' 4"  (1.626 m), weight 197 lb (89.4 kg), last menstrual  period 04/23/2015, SpO2 96 %. Body mass index is 33.81 kg/m.  General: Cooperative, alert, well developed, in no acute distress. HEENT: Conjunctivae and lids unremarkable. Cardiovascular: Regular rhythm.  Lungs: Normal work of breathing. Neurologic: No focal deficits.   Lab Results    Component Value Date   CREATININE 0.84 10/31/2019   BUN 16 10/31/2019   NA 144 10/31/2019   K 4.6 10/31/2019   CL 107 (H) 10/31/2019   CO2 26 10/31/2019   Lab Results  Component Value Date   ALT 13 10/31/2019   AST 12 10/31/2019   ALKPHOS 45 (L) 10/31/2019   BILITOT 0.3 10/31/2019   Lab Results  Component Value Date   HGBA1C 5.7 (H) 10/31/2019   HGBA1C 5.6 07/19/2019   Lab Results  Component Value Date   INSULIN 9.0 10/31/2019   INSULIN 13.3 07/19/2019   Lab Results  Component Value Date   TSH 2.460 07/19/2019   Lab Results  Component Value Date   CHOL 246 (H) 10/31/2019   HDL 41 10/31/2019   LDLCALC 176 (H) 10/31/2019   TRIG 156 (H) 10/31/2019   Lab Results  Component Value Date   WBC 4.4 07/19/2019   HGB 14.4 07/19/2019   HCT 42.2 07/19/2019   MCV 88 07/19/2019   PLT 194 07/19/2019   No results found for: IRON, TIBC, FERRITIN  Attestation Statements:   Reviewed by clinician on day of visit: allergies, medications, problem list, medical history, surgical history, family history, social history, and previous encounter notes.   I, Burt Knack, am acting as transcriptionist for Quillian Quince, MD.  I have reviewed the above documentation for accuracy and completeness, and I agree with the above. -  Quillian Quince, MD

## 2019-12-26 ENCOUNTER — Ambulatory Visit (INDEPENDENT_AMBULATORY_CARE_PROVIDER_SITE_OTHER): Payer: 59 | Admitting: Family Medicine

## 2020-01-16 ENCOUNTER — Ambulatory Visit (INDEPENDENT_AMBULATORY_CARE_PROVIDER_SITE_OTHER): Payer: 59 | Admitting: Family Medicine

## 2020-01-24 ENCOUNTER — Encounter (INDEPENDENT_AMBULATORY_CARE_PROVIDER_SITE_OTHER): Payer: Self-pay | Admitting: Family Medicine

## 2020-01-24 ENCOUNTER — Other Ambulatory Visit: Payer: Self-pay

## 2020-01-24 ENCOUNTER — Ambulatory Visit (INDEPENDENT_AMBULATORY_CARE_PROVIDER_SITE_OTHER): Payer: 59 | Admitting: Family Medicine

## 2020-01-24 VITALS — BP 113/77 | HR 74 | Temp 98.5°F | Ht 64.0 in | Wt 201.0 lb

## 2020-01-24 DIAGNOSIS — F3289 Other specified depressive episodes: Secondary | ICD-10-CM | POA: Diagnosis not present

## 2020-01-24 DIAGNOSIS — E559 Vitamin D deficiency, unspecified: Secondary | ICD-10-CM | POA: Diagnosis not present

## 2020-01-24 DIAGNOSIS — Z6834 Body mass index (BMI) 34.0-34.9, adult: Secondary | ICD-10-CM | POA: Diagnosis not present

## 2020-01-24 DIAGNOSIS — Z9189 Other specified personal risk factors, not elsewhere classified: Secondary | ICD-10-CM | POA: Diagnosis not present

## 2020-01-24 DIAGNOSIS — E669 Obesity, unspecified: Secondary | ICD-10-CM | POA: Diagnosis not present

## 2020-01-24 MED ORDER — VITAMIN D (ERGOCALCIFEROL) 1.25 MG (50000 UNIT) PO CAPS: 50000.0000 [IU] | ORAL_CAPSULE | ORAL | 0 refills | Status: AC
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
## 2020-01-25 DIAGNOSIS — F32A Depression, unspecified: Secondary | ICD-10-CM | POA: Insufficient documentation

## 2020-01-25 DIAGNOSIS — E669 Obesity, unspecified: Secondary | ICD-10-CM | POA: Insufficient documentation

## 2020-01-25 DIAGNOSIS — Z6834 Body mass index (BMI) 34.0-34.9, adult: Secondary | ICD-10-CM | POA: Insufficient documentation

## 2020-01-25 DIAGNOSIS — E559 Vitamin D deficiency, unspecified: Secondary | ICD-10-CM | POA: Insufficient documentation

## 2020-01-25 NOTE — Progress Notes (Signed)
Chief Complaint:   OBESITY Leah Mason is here to discuss her progress with her obesity treatment plan along with follow-up of her obesity related diagnoses. Leah Mason is on keeping a food journal and adhering to recommended goals of 1200-1400 calories and 80+ grams of protein daily and states she is following her eating plan approximately 0% of the time. Leah Mason states she is doing 0 minutes 0 times per week.  Today's visit was #: 8 Starting weight: 202 lbs Starting date: 07/19/2019 Today's weight: 201 lbs Today's date: 01/24/2020 Total lbs lost to date: 1 Total lbs lost since last in-office visit: 0  Interim History: Leah Mason has had a lot of stress with her job and she hasn't been able to concentrate on weight loss. She frequently skips meals and she is not meeting all her nutrient goals.  Subjective:   1. Vitamin D deficiency Leah Mason is stable on Vit D, her insurance does not cover a 90 day supply. She requests a 1 month refill.  2. Other depression, with emotional eating Leah Mason is frustrated with herself and her lack of meal planning and prepping. She states she knows what to do but can't bring herself to do it.  3. At risk for impaired metabolic function Leah Mason is at increased risk for impaired metabolic function due to deficient nutrition.  Assessment/Plan:   1. Vitamin D deficiency Low Vitamin D level contributes to fatigue and are associated with obesity, breast, and colon cancer. We will refill prescription Vitamin D for 1 month. Leah Mason will follow-up for routine testing of Vitamin D, at least 2-3 times per year to avoid over-replacement.  - Vitamin D, Ergocalciferol, (DRISDOL) 1.25 MG (50000 UNIT) CAPS capsule; Take 1 capsule (50,000 Units total) by mouth every 7 (seven) days.  Dispense: 4 capsule; Refill: 0  2. Other depression, with emotional eating Behavior modification techniques were discussed today to help Leah Mason deal with her emotional/non-hunger eating behaviors. We will refer to Dr.  Dewaine Mason, our Bariatric Psychologist for evaluation and treatment options. Orders and follow up as documented in patient record.   3. At risk for impaired metabolic function Leah Mason was given approximately 15 minutes of impaired  metabolic function prevention counseling today. We discussed intensive lifestyle modifications today with an emphasis on specific nutrition and exercise instructions and strategies.   Repetitive spaced learning was employed today to elicit superior memory formation and behavioral change.  4. Class 1 obesity with serious comorbidity and body mass index (BMI) of 34.0 to 34.9 in adult, unspecified obesity type Leah Mason is currently in the action stage of change. As such, her goal is to continue with weight loss efforts. She has agreed to change to the Category 2 Plan.   Behavioral modification strategies: no skipping meals, meal planning and cooking strategies and emotional eating strategies.  Leah Mason has agreed to follow-up with our clinic in 2 to 3 weeks. She was informed of the importance of frequent follow-up visits to maximize her success with intensive lifestyle modifications for her multiple health conditions.   Objective:   Blood pressure 113/77, pulse 74, temperature 98.5 F (36.9 C), height 5\' 4"  (1.626 m), weight 201 lb (91.2 kg), last menstrual period 04/23/2015, SpO2 96 %. Body mass index is 34.5 kg/m.  General: Cooperative, alert, well developed, in no acute distress. HEENT: Conjunctivae and lids unremarkable. Cardiovascular: Regular rhythm.  Lungs: Normal work of breathing. Neurologic: No focal deficits.   Lab Results  Component Value Date   CREATININE 0.84 10/31/2019   BUN 16 10/31/2019  NA 144 10/31/2019   K 4.6 10/31/2019   CL 107 (H) 10/31/2019   CO2 26 10/31/2019   Lab Results  Component Value Date   ALT 13 10/31/2019   AST 12 10/31/2019   ALKPHOS 45 (L) 10/31/2019   BILITOT 0.3 10/31/2019   Lab Results  Component Value Date   HGBA1C 5.7  (H) 10/31/2019   HGBA1C 5.6 07/19/2019   Lab Results  Component Value Date   INSULIN 9.0 10/31/2019   INSULIN 13.3 07/19/2019   Lab Results  Component Value Date   TSH 2.460 07/19/2019   Lab Results  Component Value Date   CHOL 246 (H) 10/31/2019   HDL 41 10/31/2019   LDLCALC 176 (H) 10/31/2019   TRIG 156 (H) 10/31/2019   Lab Results  Component Value Date   WBC 4.4 07/19/2019   HGB 14.4 07/19/2019   HCT 42.2 07/19/2019   MCV 88 07/19/2019   PLT 194 07/19/2019   No results found for: IRON, TIBC, FERRITIN  Attestation Statements:   Reviewed by clinician on day of visit: allergies, medications, problem list, medical history, surgical history, family history, social history, and previous encounter notes.   I, Burt Knack, am acting as transcriptionist for Quillian Quince, MD.  I have reviewed the above documentation for accuracy and completeness, and I agree with the above. -  Quillian Quince, MD
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
## 2020-02-06 ENCOUNTER — Telehealth (INDEPENDENT_AMBULATORY_CARE_PROVIDER_SITE_OTHER): Payer: 59 | Admitting: Psychology

## 2020-02-06 ENCOUNTER — Ambulatory Visit (AMBULATORY_SURGERY_CENTER): Payer: Self-pay

## 2020-02-06 ENCOUNTER — Encounter: Payer: Self-pay | Admitting: Gastroenterology

## 2020-02-06 ENCOUNTER — Other Ambulatory Visit: Payer: Self-pay

## 2020-02-06 VITALS — Ht 64.0 in | Wt 202.8 lb

## 2020-02-06 DIAGNOSIS — Z1509 Genetic susceptibility to other malignant neoplasm: Secondary | ICD-10-CM

## 2020-02-06 DIAGNOSIS — Z800 Family history of malignant neoplasm of digestive organs: Secondary | ICD-10-CM

## 2020-02-06 MED ORDER — SUTAB 1479-225-188 MG PO TABS: 1.0000 | ORAL_TABLET | ORAL | 0 refills | Status: DC

## 2020-02-06 NOTE — Progress Notes (Signed)
No egg or soy allergy known to patient  No issues with past sedation with any surgeries or procedures No intubation problems in the past  No FH of Malignant Hyperthermia No diet pills per patient No home 02 use per patient  No blood thinners per patient  Pt reports issues with constipation ---patient advised Miralax twice daily for 5 days prior to procedure/prep (reports BM once every 4-5 days with inconsistent diet) No A fib or A flutter  Patient reports last "seizure" was before 07/2019 EMMI video via MyChart  COVID 19 guidelines implemented in PV today with Pt and RN  Coupon given to pt in PV today, Code to Pharmacy  COVID vaccines completed on 01/2020 per pt;  Due to the COVID-19 pandemic we are asking patients to follow these guidelines. Please only bring one care partner. Please be aware that your care partner may wait in the car in the parking lot or if they feel like they will be too hot to wait in the car, they may wait in the lobby on the 4th floor. All care partners are required to wear a mask the entire time (we do not have any that we can provide them), they need to practice social distancing, and we will do a Covid check for all patient's and care partners when you arrive. Also we will check their temperature and your temperature. If the care partner waits in their car they need to stay in the parking lot the entire time and we will call them on their cell phone when the patient is ready for discharge so they can bring the car to the front of the building. Also all patient's will need to wear a mask into building.

## 2020-02-08 ENCOUNTER — Other Ambulatory Visit: Payer: Self-pay

## 2020-02-08 ENCOUNTER — Telehealth (INDEPENDENT_AMBULATORY_CARE_PROVIDER_SITE_OTHER): Payer: 59 | Admitting: Psychology

## 2020-02-08 DIAGNOSIS — F5089 Other specified eating disorder: Secondary | ICD-10-CM | POA: Diagnosis not present

## 2020-02-08 NOTE — Progress Notes (Signed)
Entered in error

## 2020-02-16 ENCOUNTER — Encounter (INDEPENDENT_AMBULATORY_CARE_PROVIDER_SITE_OTHER): Payer: Self-pay

## 2020-02-16 ENCOUNTER — Telehealth (INDEPENDENT_AMBULATORY_CARE_PROVIDER_SITE_OTHER): Payer: Self-pay | Admitting: Psychology

## 2020-02-16 ENCOUNTER — Encounter (INDEPENDENT_AMBULATORY_CARE_PROVIDER_SITE_OTHER): Payer: 59 | Admitting: Psychology

## 2020-02-16 NOTE — Progress Notes (Signed)
Office: [phone]  /  Fax: [phone]    Date: February 23, 2020   Appointment Start Time: 3:34pm Duration: 24 minutes Provider: Lawerance Cruel, Psy.D. Type of Session: Individual Therapy  Location of Patient: Work Location of Provider: Provider's Home Type of Contact: Telepsychological Visit via MyChart Video Visit  Session Content: This provider called Leah Mason at 3:32pm as she did not present for the telepsychological appointment. She noted she just finished with a meeting and would join today's appointment. As such, today's appointment was initiated 4 minutes late. Leah Mason is a 44 y.o. female presenting for a follow-up appointment to address the previously established treatment goal of increasing coping skills. Today's appointment was a telepsychological visit due to COVID-19. Leah Mason provided verbal consent for today's telepsychological appointment and she is aware she is responsible for securing confidentiality on her end of the session. Prior to proceeding with today's appointment, Leah Mason's physical location at the time of this appointment was obtained as well a phone number she could be reached at in the event of technical difficulties. Leah Mason and this provider participated in today's telepsychological service.   This provider conducted a brief check-in. Leah Mason stated she ate yogurt on some days for breakfast, but not all the food for the breakfast portion of her structured meal plan. This provider explored what Leah Mason feels may have gotten in the way of eating the rest of her breakfast. She explained it was due to not having the foods in the house. Notably, Leah Mason reported an increase in water intake and shared she ate tuna and crackers for lunch today in addition to the yogurt for breakfast. Positive reinforcement was provided. Based on Leah Mason's recent eating habits, it was reflected Leah Mason would likely benefit from food options that are convenient given her schedule. She agreed. Thus, she was engaged in  brainstorming other food options that she would be receptive to purchasing to consume for breakfast and lunch (e.g., fruits, vegetables, tuna packets, jerky, string cheese). This provider also recommended leaving certain foods at work and setting alarms to begin establishing a routine for eating regularly. Leah Mason agreed. Notably, this provider discussed her upcoming maternity leave toward the end of November. Leah Mason acknowledged understanding given the uncertain nature of the circumstances, this provider may be out of the office sooner. This provider and Leah Mason discussed referral options and verbal consent was provided for this provider to send a list of referral options via e-mail. All questions/concerns were addressed. Leah Mason denied any concerns. Overall, Leah Mason was receptive to today's appointment as evidenced by openness to sharing, responsiveness to feedback, and willingness to implement discussed strategies .  Mental Status Examination:  Appearance: well groomed and appropriate hygiene  Behavior: appropriate to circumstances Mood: euthymic Affect: mood congruent Speech: normal in rate, volume, and tone Eye Contact: appropriate Psychomotor Activity: appropriate Gait: unable to assess Thought Process: linear, logical, and goal directed  Thought Content/Perception: no hallucinations, delusions, bizarre thinking or behavior reported or observed and no evidence of suicidal and homicidal ideation, plan, and intent Orientation: time, person, place, and purpose of appointment Memory/Concentration: memory, attention, language, and fund of knowledge intact  Insight/Judgment: fair  Interventions:  Conducted a brief chart review Provided empathic reflections and validation Employed supportive psychotherapy interventions to facilitate reduced distress and to improve coping skills with identified stressors Engaged patient in problem solving  Provided positive reinforcement   DSM-5 Diagnosis(es): 307.59 (F50.8)  Other Specified Feeding or Eating Disorder, Limiting food intake  Treatment Goal & Progress: During the initial appointment with this provider,  the following treatment goal was established: increase coping skills. Progress is limited, as Leah Mason has just begun treatment with this provider; however, she is receptive to the interaction and interventions and rapport is being established.   Plan: Leah Mason reported she was informed recently that she will be traveling for work in the coming weeks, but noted uncertainty about when. Thus, she reported a plan to call to schedule her next follow-up once she has additional travel details. She was receptive to this provider/provider's clinic calling in approximately one week to check-in and schedule a follow-up appointment if needed.

## 2020-02-16 NOTE — Telephone Encounter (Signed)
°  Office: [phone]  /  Fax: [phone]  Date of Encounter: February 16, 2020  Time of Encounter: 3:59pm Duration of Encounter: ~3.5 minutes Provider: Lawerance Cruel, PsyD  CONTENT: Leah Mason presented on time for her follow-up appointment via MyChart Video Visit. She indicated she would only be able to stay for approximately 5 minutes as it is month's end at work and she is busy. She explained she did not cancel/reschedule today's appointment as she did not want to be charged a no show fee. This provider explained the one time no show fee waiver can be applied and recommended today's appointment be rescheduled. She acknowledged understanding. Notably, Leah Mason stated she did not eat today; therefore, she was encouraged to eat something after signing off today's appointment. She agreed. No evidence or endorsement of safety concerns.   PLAN: Leah Mason is scheduled for an appointment on February 23, 2020 at 3:30pm.

## 2020-02-17 ENCOUNTER — Encounter: Payer: 59 | Admitting: Gastroenterology

## 2020-02-20 ENCOUNTER — Ambulatory Visit (INDEPENDENT_AMBULATORY_CARE_PROVIDER_SITE_OTHER): Payer: 59 | Admitting: Family Medicine

## 2020-02-23 ENCOUNTER — Telehealth (INDEPENDENT_AMBULATORY_CARE_PROVIDER_SITE_OTHER): Payer: 59 | Admitting: Psychology

## 2020-02-23 DIAGNOSIS — F5089 Other specified eating disorder: Secondary | ICD-10-CM

## 2020-06-01 ENCOUNTER — Encounter: Payer: Self-pay | Admitting: Genetic Counselor

## 2020-10-25 ENCOUNTER — Other Ambulatory Visit: Payer: Self-pay | Admitting: Family Medicine

## 2020-10-25 DIAGNOSIS — Z1231 Encounter for screening mammogram for malignant neoplasm of breast: Secondary | ICD-10-CM

## 2020-11-02 ENCOUNTER — Ambulatory Visit: Payer: 59

## 2020-12-27 ENCOUNTER — Ambulatory Visit: Payer: 59

## 2021-01-04 ENCOUNTER — Other Ambulatory Visit: Payer: Self-pay

## 2021-01-04 ENCOUNTER — Ambulatory Visit: Payer: 59

## 2021-01-04 ENCOUNTER — Ambulatory Visit
Admission: RE | Admit: 2021-01-04 | Discharge: 2021-01-04 | Disposition: A | Discharge status: Home / Self Care | Payer: 59 | Source: Ambulatory Visit | Attending: Family Medicine | Admitting: Family Medicine

## 2021-01-04 DIAGNOSIS — Z1231 Encounter for screening mammogram for malignant neoplasm of breast: Secondary | ICD-10-CM

## 2021-02-07 IMAGING — MG DIGITAL SCREENING BILAT W/ CAD
4 series · 4 of 4 positions shown · non-contrast
Comparison: Previous exam(s).

CLINICAL DATA: Screening.

EXAM:
DIGITAL SCREENING BILATERAL MAMMOGRAM WITH CAD

[R MLO]
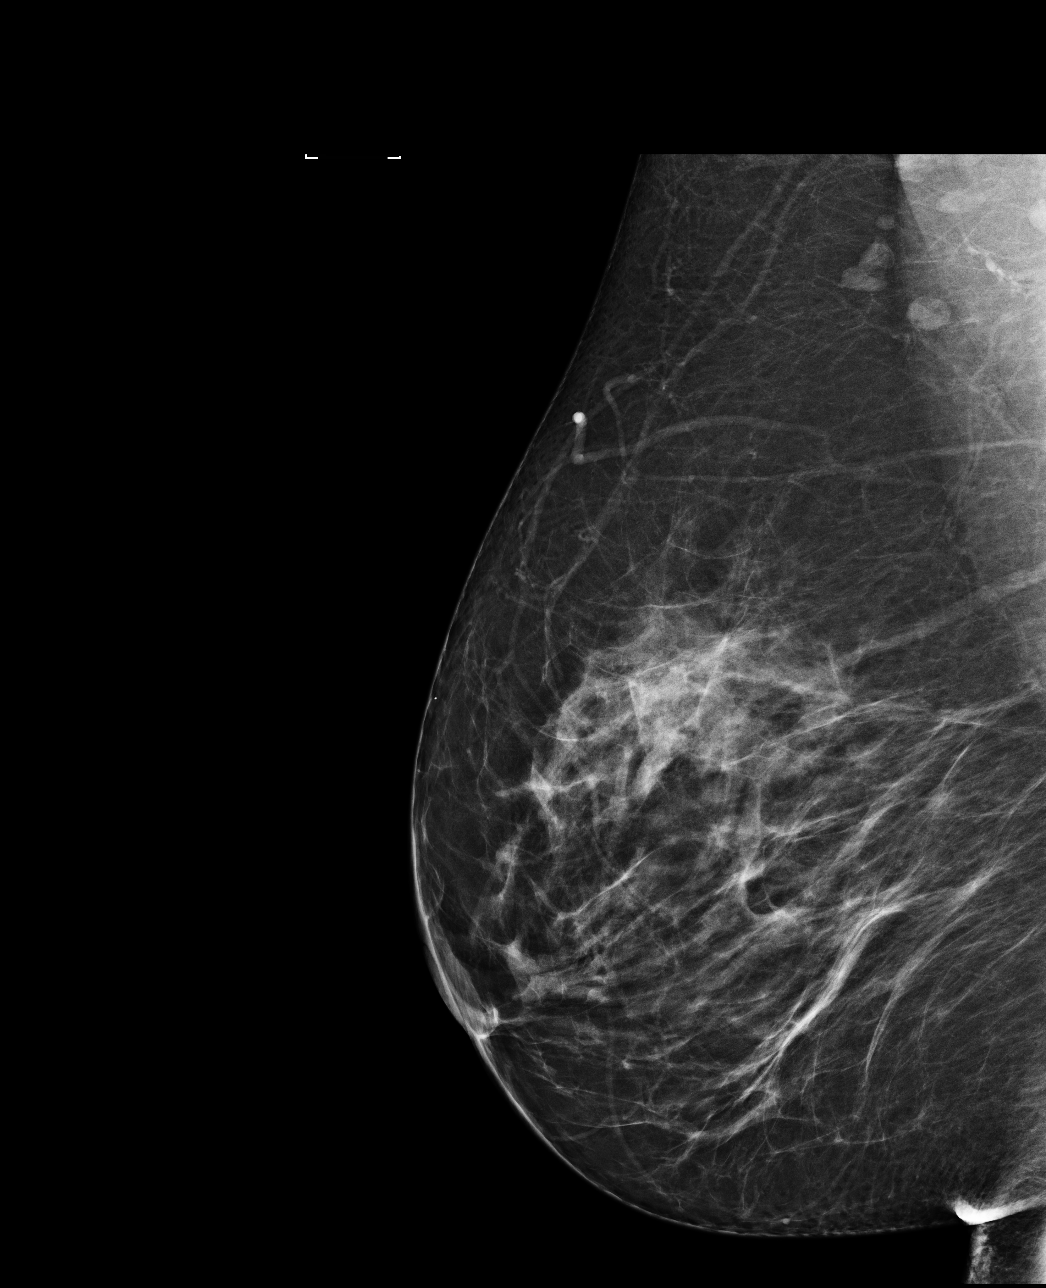

[L CC]
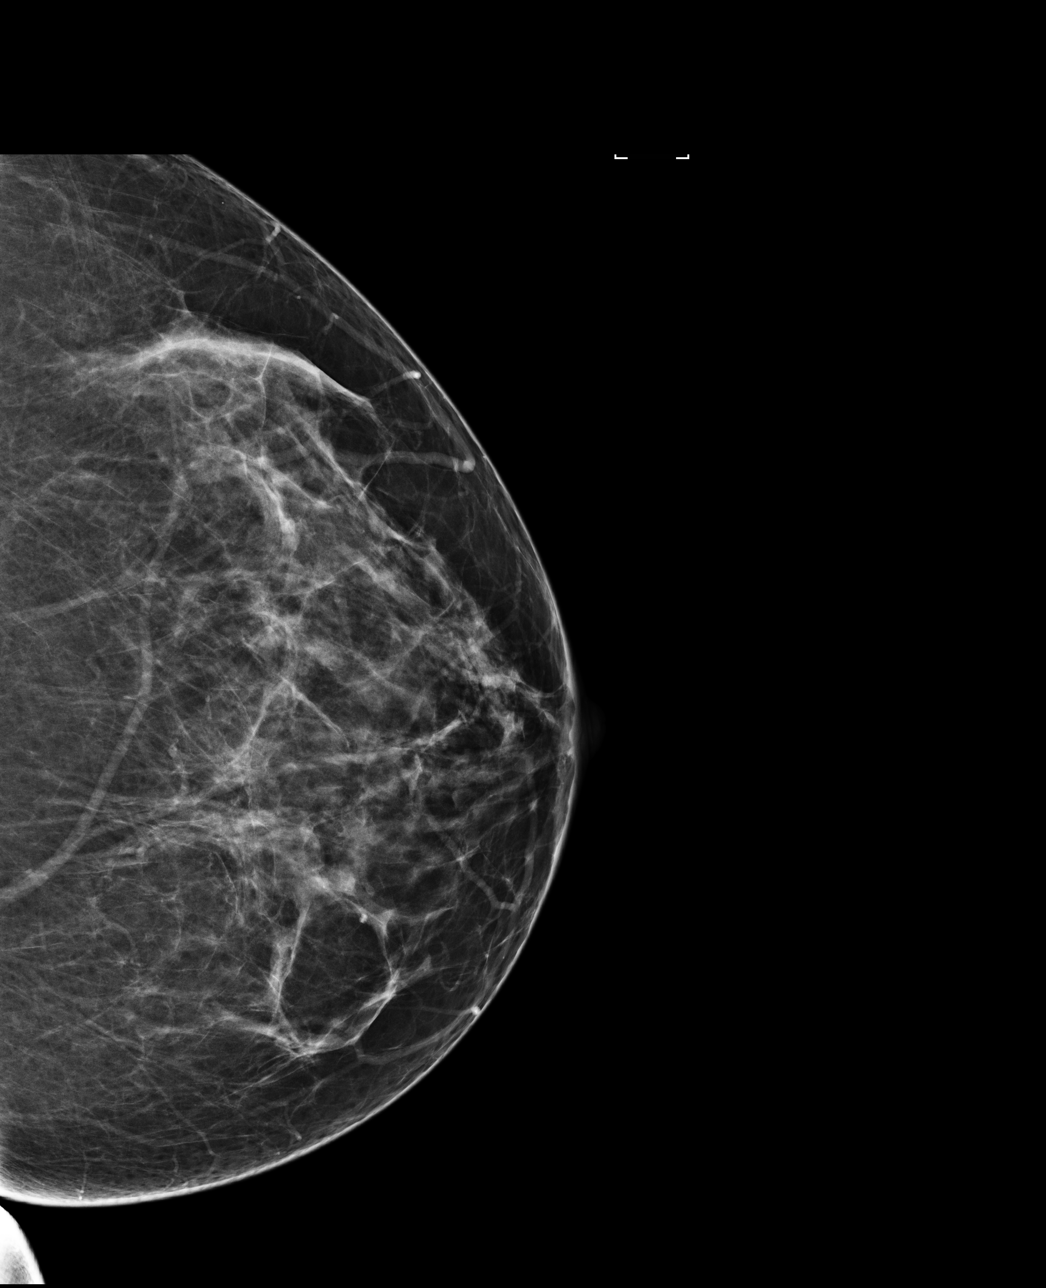

[R CC]
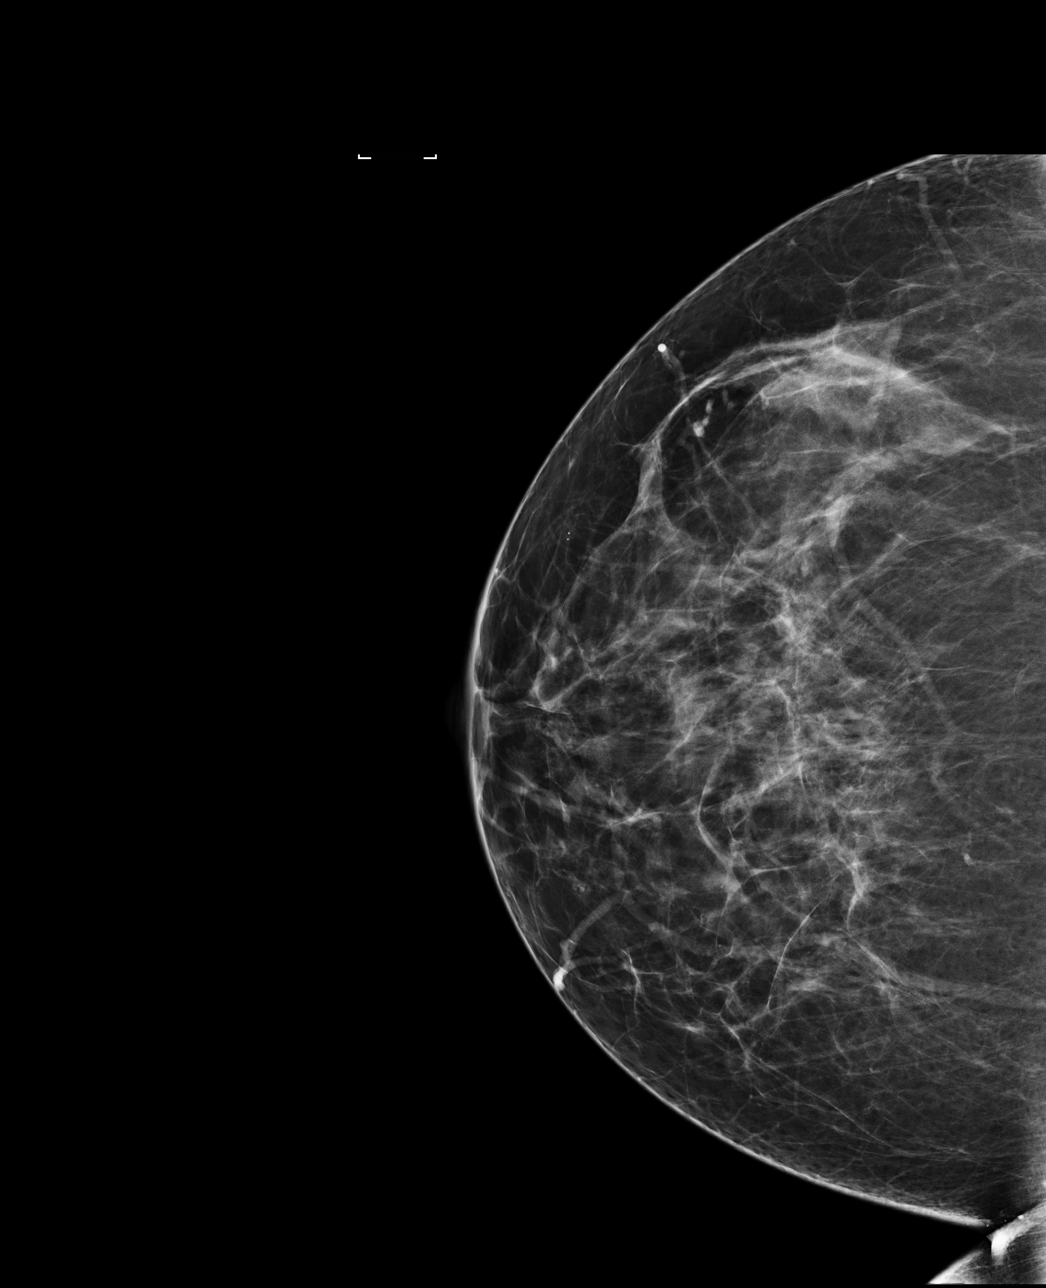

[L MLO]
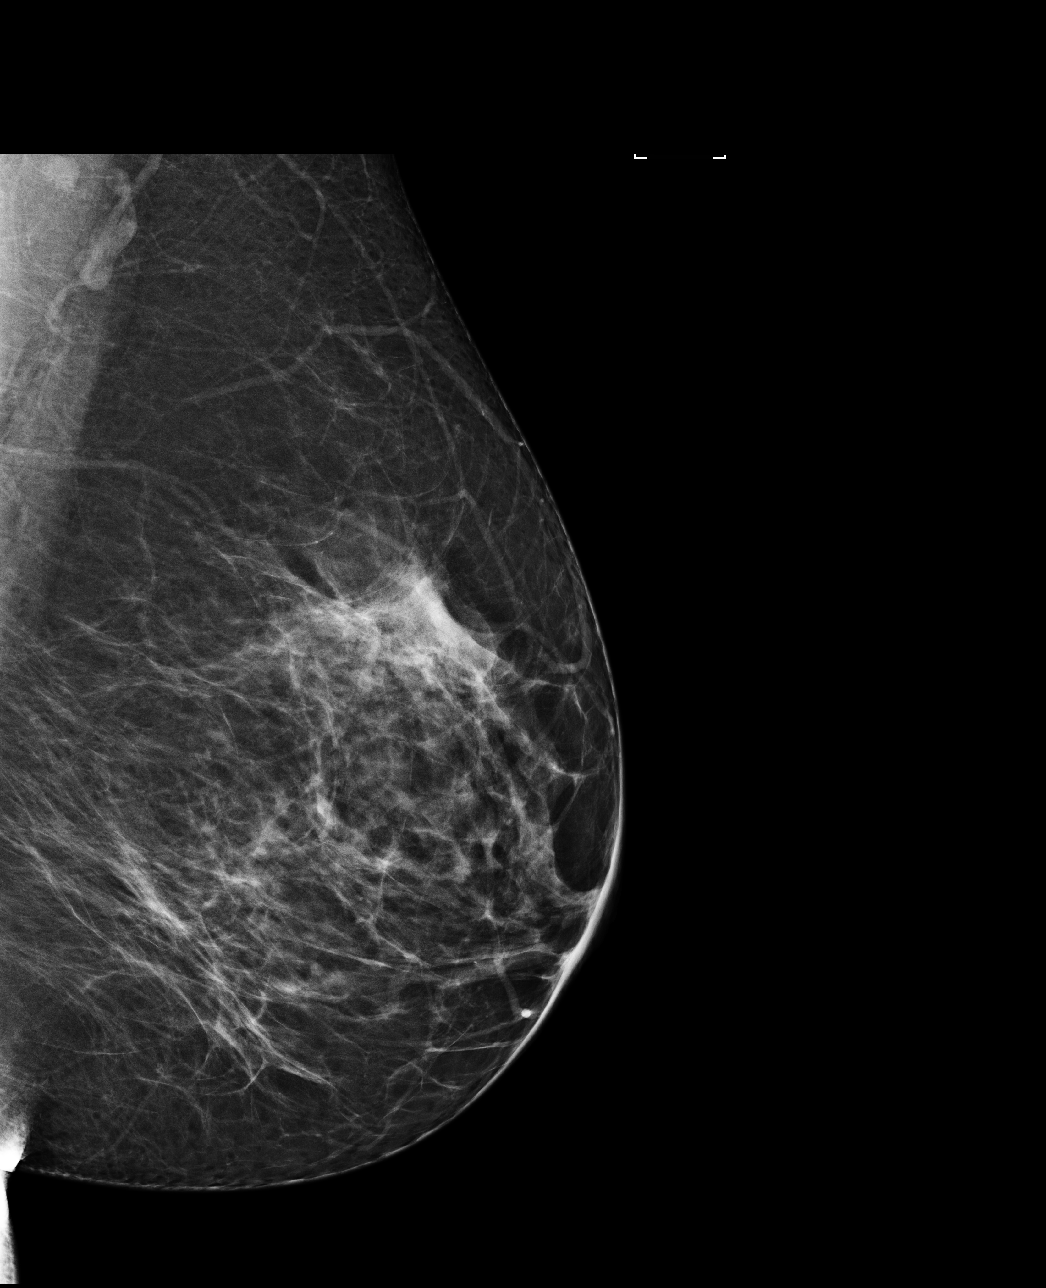

[4 of 4 positions shown; findings below may reference images not displayed]

ACR Breast Density Category b: There are scattered areas of
fibroglandular density.
FINDINGS: There are no findings suspicious for malignancy. Images were
processed with CAD.
IMPRESSION: No mammographic evidence of malignancy. A result letter of this
screening mammogram will be mailed directly to the patient.

RECOMMENDATION:
Screening mammogram in one year. (Code:AS-G-LCT)

BI-RADS CATEGORY  1: Negative.

## 2021-12-25 ENCOUNTER — Encounter (INDEPENDENT_AMBULATORY_CARE_PROVIDER_SITE_OTHER): Payer: Self-pay

## 2022-02-11 ENCOUNTER — Other Ambulatory Visit: Payer: Self-pay | Admitting: Family Medicine

## 2022-02-11 DIAGNOSIS — Z1231 Encounter for screening mammogram for malignant neoplasm of breast: Secondary | ICD-10-CM

## 2022-02-12 ENCOUNTER — Ambulatory Visit
Admission: RE | Admit: 2022-02-12 | Discharge: 2022-02-12 | Disposition: A | Discharge status: Home / Self Care | Payer: 59 | Source: Ambulatory Visit | Attending: Family Medicine | Admitting: Family Medicine

## 2022-02-12 DIAGNOSIS — Z1231 Encounter for screening mammogram for malignant neoplasm of breast: Secondary | ICD-10-CM

## 2023-01-28 ENCOUNTER — Encounter: Payer: Self-pay | Admitting: Family Medicine

## 2023-03-16 ENCOUNTER — Other Ambulatory Visit: Payer: Self-pay | Admitting: Family Medicine

## 2023-03-16 DIAGNOSIS — Z0000 Encounter for general adult medical examination without abnormal findings: Secondary | ICD-10-CM

## 2023-03-20 ENCOUNTER — Ambulatory Visit
Admission: RE | Admit: 2023-03-20 | Discharge: 2023-03-20 | Disposition: A | Discharge status: Home / Self Care | Payer: 59 | Source: Ambulatory Visit | Attending: Family Medicine | Admitting: Family Medicine

## 2023-03-20 DIAGNOSIS — Z0000 Encounter for general adult medical examination without abnormal findings: Secondary | ICD-10-CM

## 2023-05-06 ENCOUNTER — Encounter: Payer: Self-pay | Admitting: Gastroenterology

## 2023-06-17 ENCOUNTER — Ambulatory Visit (AMBULATORY_SURGERY_CENTER): Payer: 59

## 2023-06-17 VITALS — Ht 64.0 in | Wt 215.0 lb

## 2023-06-17 DIAGNOSIS — Z1509 Genetic susceptibility to other malignant neoplasm: Secondary | ICD-10-CM

## 2023-06-17 MED ORDER — SUFLAVE 178.7 G PO SOLR: 1.0000 | Freq: Once | ORAL | 0 refills | Status: AC

## 2023-06-17 NOTE — Progress Notes (Signed)

## 2023-06-19 ENCOUNTER — Encounter: Payer: Self-pay | Admitting: Gastroenterology

## 2023-06-23 ENCOUNTER — Telehealth: Payer: Self-pay | Admitting: Gastroenterology

## 2023-06-23 NOTE — Telephone Encounter (Signed)
Inbound call from patient, states she was diagnosed with the flu yesterday, denies a fever, and would like to know if she can continue with her procedure.

## 2023-06-23 NOTE — Telephone Encounter (Signed)
 Ok, thank you

## 2023-06-23 NOTE — Telephone Encounter (Signed)
Pt dx with flu yesterday, procedure sched for Monday r/s for next month.

## 2023-06-29 ENCOUNTER — Encounter: Payer: 59 | Admitting: Gastroenterology

## 2023-07-29 ENCOUNTER — Encounter: Payer: Self-pay | Admitting: Gastroenterology

## 2023-07-29 ENCOUNTER — Ambulatory Visit (AMBULATORY_SURGERY_CENTER): Payer: 59 | Admitting: Gastroenterology

## 2023-07-29 VITALS — BP 93/51 | HR 67 | Temp 98.4°F | Resp 11 | Ht 64.0 in | Wt 215.0 lb

## 2023-07-29 DIAGNOSIS — K644 Residual hemorrhoidal skin tags: Secondary | ICD-10-CM | POA: Diagnosis not present

## 2023-07-29 DIAGNOSIS — K317 Polyp of stomach and duodenum: Secondary | ICD-10-CM

## 2023-07-29 DIAGNOSIS — K648 Other hemorrhoids: Secondary | ICD-10-CM | POA: Diagnosis not present

## 2023-07-29 DIAGNOSIS — Z1211 Encounter for screening for malignant neoplasm of colon: Secondary | ICD-10-CM

## 2023-07-29 DIAGNOSIS — Z1509 Genetic susceptibility to other malignant neoplasm: Secondary | ICD-10-CM | POA: Diagnosis not present

## 2023-07-29 MED ORDER — SODIUM CHLORIDE 0.9 % IV SOLN: 500.0000 mL | Freq: Once | INTRAVENOUS | Status: DC

## 2023-07-29 NOTE — Progress Notes (Unsigned)
 [facility] History and Physical   Primary Care Physician:  Koren Shiver, DO   Reason for Procedure:  Lynch syndrome  Plan:    EGD and colonoscopy with possible interventions as needed     HPI: Leah Mason is a very pleasant 48 y.o. female here for EGD and colonoscopy for surveillance with h/o Lynch syndrome.   The risks and benefits as well as alternatives of endoscopic procedure(s) have been discussed and reviewed. All questions answered. The patient agrees to proceed.    Past Medical History:  Diagnosis Date   Constipation    Depression    hx of   Family history of breast cancer    Family history of Lynch syndrome    Family history of uterine cancer    Hyperlipidemia    Insulin resistance 09/2019   not on meds for tx at this time   Lynch syndrome    PMS2-related Lynch syndrome (HNPCC4)    Seizures (HCC)    last seizure was "about a year ago, not sure of when exactly per pt"=patient reports last seizure was more than 6 months ago (before 07/2019);   Vitamin D deficiency     Past Surgical History:  Procedure Laterality Date   ABDOMINAL HYSTERECTOMY     CESAREAN SECTION     x 3   COLONOSCOPY  10/2016   hems/benign colonic mucosa with erosion   ROBOTIC ASSISTED LAPAROSCOPIC LYSIS OF ADHESION N/A 05/02/2015   Procedure: ROBOTIC ASSISTED LAPAROSCOPIC EXTENSIVE LYSIS OF ADHESIONS;  Surgeon: Gerald Leitz, MD;  Location: [facility];  Service: Gynecology;  Laterality: N/A;   ROBOTIC ASSISTED TOTAL HYSTERECTOMY WITH BILATERAL SALPINGO OOPHERECTOMY N/A 05/02/2015   Procedure: ROBOTIC ASSISTED TOTAL HYSTERECTOMY ;  Surgeon: Gerald Leitz, MD;  Location: [facility];  Service: Gynecology;  Laterality: N/A;   SALPINGOOPHORECTOMY Bilateral 05/02/2015   Procedure: SALPINGO OOPHORECTOMY;  Surgeon: Gerald Leitz, MD;  Location: [facility];  Service: Gynecology;  Laterality: Bilateral;   TUBAL LIGATION     WISDOM TOOTH EXTRACTION      Prior to Admission medications    Medication Sig Start Date End Date Taking? Authorizing Provider  atorvastatin (LIPITOR) 20 MG tablet Take 20 mg by mouth daily. 06/05/23  Yes [provider]  Calcium-Magnesium-Vitamin D 600-40-500 MG-MG-UNIT TB24 Take 1 capsule by mouth daily.    [provider]  Lactobacillus (PROBIOTIC ACIDOPHILUS) CAPS Take by mouth.    [provider]  magnesium oxide (MAG-OX) 400 MG tablet Take 400 mg by mouth daily.    [provider]  Multiple Vitamin (MULTIVITAMIN WITH MINERALS) TABS tablet Take 1 tablet by mouth daily.    [provider]  Vitamin D, Ergocalciferol, (DRISDOL) 1.25 MG (50000 UNIT) CAPS capsule Take 1 capsule (50,000 Units total) by mouth every 7 (seven) days. Patient not taking: Reported on 06/17/2023 01/24/20   Quillian Quince D, MD    Current Outpatient Medications  Medication Sig Dispense Refill   atorvastatin (LIPITOR) 20 MG tablet Take 20 mg by mouth daily.     Calcium-Magnesium-Vitamin D 600-40-500 MG-MG-UNIT TB24 Take 1 capsule by mouth daily.     Lactobacillus (PROBIOTIC ACIDOPHILUS) CAPS Take by mouth.     magnesium oxide (MAG-OX) 400 MG tablet Take 400 mg by mouth daily.     Multiple Vitamin (MULTIVITAMIN WITH MINERALS) TABS tablet Take 1 tablet by mouth daily.     Vitamin D, Ergocalciferol, (DRISDOL) 1.25 MG (50000 UNIT) CAPS capsule Take 1 capsule (50,000 Units total) by mouth every 7 (seven)  days. (Patient not taking: Reported on 06/17/2023) 4 capsule 0   Current Facility-Administered Medications  Medication Dose Route Frequency Provider Last Rate Last Admin   0.9 %  sodium chloride infusion  500 mL Intravenous Continuous Jacory Kamel V, MD       0.9 %  sodium chloride infusion  500 mL Intravenous Once Napoleon Form, MD        Allergies as of 07/29/2023   (No Known Allergies)    Family History  Problem Relation Age of Onset   Breast cancer Mother 80   Uterine cancer Mother 24   Colon polyps Sister    COPD  Maternal Aunt    Colon cancer Maternal Grandmother    Prostate cancer Maternal Grandfather        dx in his 68s   Cancer Cousin 6       wilms tumor   Colon cancer Cousin        mother's maternal cousin   Pancreatic cancer Cousin        mother's paternal first cousin   Colon cancer Other        mother's maternal great aunt   Esophageal cancer Neg Hx    Rectal cancer Neg Hx    Stomach cancer Neg Hx     Social History   Socioeconomic History   Marital status: Single    Spouse name: Not on file   Number of children: 3   Years of education: AS   Highest education level: Not on file  Occupational History   Occupation: [employer]  Tobacco Use   Smoking status: Never   Smokeless tobacco: Never  Vaping Use   Vaping status: Never Used  Substance and Sexual Activity   Alcohol use: No    Alcohol/week: 0.0 standard drinks of alcohol   Drug use: No   Sexual activity: Yes    Comment: hysterectomy with ovaries removed  Other Topics Concern   Not on file  Social History Narrative   Patient lives at home with her children    Patient is right handed   Patient drinks coffee daily   Social Drivers of Corporate investment banker Strain: Not on file  Food Insecurity: Not on file  Transportation Needs: Not on file  Physical Activity: Not on file  Stress: Not on file  Social Connections: Not on file  Intimate Partner Violence: Not on file    Review of Systems:  All other review of systems negative except as mentioned in the HPI.  Physical Exam: Vital signs in last 24 hours: BP 123/68   Pulse 70   Temp 98.4 F (36.9 C) (Skin)   Resp 16   Ht 5\' 4"  (1.626 m)   Wt 215 lb (97.5 kg)   LMP 04/23/2015 (Exact Date)   SpO2 95%   BMI 36.90 kg/m  General:   Alert, NAD Lungs:  Clear .   Heart:  Regular rate and rhythm Abdomen:  Soft, nontender and nondistended. Neuro/Psych:  Alert and cooperative. Normal mood and affect. A and O x 3  Reviewed labs, radiology imaging,  old records and pertinent past GI work up  Patient is appropriate for planned procedure(s) and anesthesia in an ambulatory setting   K. Scherry Ran , MD [phone]

## 2023-07-29 NOTE — Patient Instructions (Signed)
 Educational handout provided to patient related to Hemorrhoids  Resume previous diet  Continue present medications  Awaiting pathology results   YOU HAD AN ENDOSCOPIC PROCEDURE TODAY AT THE [facility]:   Refer to the procedure report that was given to you for any specific questions about what was found during the examination.  If the procedure report does not answer your questions, please call your gastroenterologist to clarify.  If you requested that your care partner not be given the details of your procedure findings, then the procedure report has been included in a sealed envelope for you to review at your convenience later.  YOU SHOULD EXPECT: Some feelings of bloating in the abdomen. Passage of more gas than usual.  Walking can help get rid of the air that was put into your GI tract during the procedure and reduce the bloating. If you had a lower endoscopy (such as a colonoscopy or flexible sigmoidoscopy) you may notice spotting of blood in your stool or on the toilet paper. If you underwent a bowel prep for your procedure, you may not have a normal bowel movement for a few days.  Please Note:  You might notice some irritation and congestion in your nose or some drainage.  This is from the oxygen used during your procedure.  There is no need for concern and it should clear up in a day or so.  SYMPTOMS TO REPORT IMMEDIATELY:  Following lower endoscopy (colonoscopy or flexible sigmoidoscopy):  Excessive amounts of blood in the stool  Significant tenderness or worsening of abdominal pains  Swelling of the abdomen that is new, acute  Fever of 100F or higher  Following upper endoscopy (EGD)  Vomiting of blood or coffee ground material  New chest pain or pain under the shoulder blades  Painful or persistently difficult swallowing  New shortness of breath  Fever of 100F or higher  Black, tarry-looking stools  For urgent or emergent issues, a gastroenterologist can be  reached at any hour by calling [phone]. Do not use MyChart messaging for urgent concerns.    DIET:  We do recommend a small meal at first, but then you may proceed to your regular diet.  Drink plenty of fluids but you should avoid alcoholic beverages for 24 hours.  ACTIVITY:  You should plan to take it easy for the rest of today and you should NOT DRIVE or use heavy machinery until tomorrow (because of the sedation medicines used during the test).    FOLLOW UP: Our staff will call the number listed on your records the next business day following your procedure.  We will call around 7:15- 8:00 am to check on you and address any questions or concerns that you may have regarding the information given to you following your procedure. If we do not reach you, we will leave a message.     If any biopsies were taken you will be contacted by phone or by letter within the next 1-3 weeks.  Please call us at [phone] if you have not heard about the biopsies in 3 weeks.    SIGNATURES/CONFIDENTIALITY: You and/or your care partner have signed paperwork which will be entered into your electronic medical record.  These signatures attest to the fact that that the information above on your After Visit Summary has been reviewed and is understood.  Full responsibility of the confidentiality of this discharge information lies with you and/or your care-partner.

## 2023-07-29 NOTE — Op Note (Signed)
 [facility] Patient Name: Leah Mason Procedure Date: 07/29/2023 1:56 PM MRN: [id number] Endoscopist: Napoleon Form , MD, [id number] Age: 48 Referring MD:  Date of Birth: Jun 14, 1975 Gender: Female Account #: [id number] Procedure:                Upper GI endoscopy Indications:              Surveillance for malignancy secondary to Lynch                            Syndrome Medicines:                Monitored Anesthesia Care Procedure:                Pre-Anesthesia Assessment:                           - Prior to the procedure, a History and Physical                            was performed, and patient medications and                            allergies were reviewed. The patient's tolerance of                            previous anesthesia was also reviewed. The risks                            and benefits of the procedure and the sedation                            options and risks were discussed with the patient.                            All questions were answered, and informed consent                            was obtained. Prior Anticoagulants: The patient has                            taken no anticoagulant or antiplatelet agents. ASA                            Grade Assessment: II - A patient with mild systemic                            disease. After reviewing the risks and benefits,                            the patient was deemed in satisfactory condition to                            undergo the procedure.  After obtaining informed consent, the endoscope was                            passed under direct vision. Throughout the                            procedure, the patient's blood pressure, pulse, and                            oxygen saturations were monitored continuously. The                            Olympus Scope [id number] was introduced through the                            mouth, and advanced to the second part of  duodenum.                            The upper GI endoscopy was accomplished without                            difficulty. The patient tolerated the procedure                            well. Scope In: Scope Out: Findings:                 The Z-line was regular and was found 35 cm from the                            incisors.                           No gross lesions were noted in the entire esophagus.                           Four 3 to 8 mm sessile polyps with no bleeding and                            no stigmata of recent bleeding were found in the                            gastric fundus and in the gastric body. The polyp                            was removed with a cold biopsy forceps. Resection                            and retrieval were complete.                           The cardia and gastric fundus were normal on  retroflexion.                           The examined duodenum was normal. Complications:            No immediate complications. Estimated Blood Loss:     Estimated blood loss was minimal. Impression:               - Z-line regular, 35 cm from the incisors.                           - No gross lesions in the entire esophagus.                           - Four gastric polyps. Resected and retrieved.                           - Normal examined duodenum. Recommendation:           - Resume previous diet.                           - Continue present medications.                           - Await pathology results.                           - Repeat upper endoscopy in 3 years for                            surveillance. Napoleon Form, MD 07/29/2023 2:53:37 PM This report has been signed electronically.

## 2023-07-29 NOTE — Progress Notes (Unsigned)
 Pt's states no medical or surgical changes since previsit or office visit.

## 2023-07-29 NOTE — Progress Notes (Signed)
 Called to room to assist during endoscopic procedure.  Patient ID and intended procedure confirmed with present staff. Received instructions for my participation in the procedure from the performing physician.

## 2023-07-29 NOTE — Op Note (Signed)
 [facility] Patient Name: Leah Mason Procedure Date: 07/29/2023 1:52 PM MRN: [id number] Endoscopist: Napoleon Form , MD, [id number] Age: 48 Referring MD:  Date of Birth: Jul 04, 1975 Gender: Female Account #: [id number] Procedure:                Colonoscopy Indications:              Lynch Syndrome Medicines:                Monitored Anesthesia Care Procedure:                Pre-Anesthesia Assessment:                           - Prior to the procedure, a History and Physical                            was performed, and patient medications and                            allergies were reviewed. The patient's tolerance of                            previous anesthesia was also reviewed. The risks                            and benefits of the procedure and the sedation                            options and risks were discussed with the patient.                            All questions were answered, and informed consent                            was obtained. Prior Anticoagulants: The patient has                            taken no anticoagulant or antiplatelet agents. ASA                            Grade Assessment: II - A patient with mild systemic                            disease. After reviewing the risks and benefits,                            the patient was deemed in satisfactory condition to                            undergo the procedure.                           After obtaining informed consent, the colonoscope  was passed under direct vision. Throughout the                            procedure, the patient's blood pressure, pulse, and                            oxygen saturations were monitored continuously. The                            Olympus Scope [id number] was introduced through the                            anus and advanced to the the cecum, identified by                            appendiceal orifice and ileocecal  valve. The                            colonoscopy was performed without difficulty. The                            patient tolerated the procedure well. The quality                            of the bowel preparation was good. The ileocecal                            valve, appendiceal orifice, and rectum were                            photographed. Scope In: 2:11:17 PM Scope Out: 2:28:13 PM Scope Withdrawal Time: 0 hours 11 minutes 53 seconds  Total Procedure Duration: 0 hours 16 minutes 56 seconds  Findings:                 The perianal and digital rectal examinations were                            normal.                           Non-bleeding external and internal hemorrhoids were                            found during retroflexion. The hemorrhoids were                            small.                           The exam was otherwise without abnormality. Complications:            No immediate complications. Estimated Blood Loss:     Estimated blood loss was minimal. Impression:               - Non-bleeding external and internal hemorrhoids.                           -  The examination was otherwise normal.                           - No specimens collected. Recommendation:           - Patient has a contact number available for                            emergencies. The signs and symptoms of potential                            delayed complications were discussed with the                            patient. Return to normal activities tomorrow.                            Written discharge instructions were provided to the                            patient.                           - Resume previous diet.                           - Continue present medications.                           - Repeat colonoscopy in 2 years for surveillance. Napoleon Form, MD 07/29/2023 2:39:12 PM This report has been signed electronically.

## 2023-07-29 NOTE — Progress Notes (Unsigned)
 Vss nad trans to pacu

## 2023-07-30 ENCOUNTER — Telehealth: Payer: Self-pay | Admitting: *Deleted

## 2023-07-30 NOTE — Telephone Encounter (Signed)
 Attempted f/u phone call. No answer. Left message.

## 2023-08-03 LAB — SURGICAL PATHOLOGY

## 2023-09-24 ENCOUNTER — Encounter: Payer: Self-pay | Admitting: Gastroenterology

## 2024-04-19 ENCOUNTER — Other Ambulatory Visit: Payer: Self-pay | Admitting: Physician Assistant

## 2024-04-19 DIAGNOSIS — Z1231 Encounter for screening mammogram for malignant neoplasm of breast: Secondary | ICD-10-CM

## 2024-04-20 ENCOUNTER — Inpatient Hospital Stay
Admission: RE | Admit: 2024-04-20 | Discharge: 2024-04-20 | Attending: Physician Assistant | Admitting: Physician Assistant

## 2024-04-20 DIAGNOSIS — Z1231 Encounter for screening mammogram for malignant neoplasm of breast: Secondary | ICD-10-CM
